# Patient Record
Sex: Female | Born: 1985 | Race: Black or African American | Hispanic: No | Marital: Married | Smoking: Never smoker
Health system: Southern US, Community
[De-identification: ages and names within clinical notes are randomized; demographics above are authoritative.]

## PROBLEM LIST (undated history)

## (undated) ENCOUNTER — Inpatient Hospital Stay (HOSPITAL_COMMUNITY): Payer: Self-pay

## (undated) DIAGNOSIS — O139 Gestational [pregnancy-induced] hypertension without significant proteinuria, unspecified trimester: Secondary | ICD-10-CM

## (undated) DIAGNOSIS — O24419 Gestational diabetes mellitus in pregnancy, unspecified control: Secondary | ICD-10-CM

## (undated) DIAGNOSIS — A6000 Herpesviral infection of urogenital system, unspecified: Secondary | ICD-10-CM

## (undated) HISTORY — DX: Gestational (pregnancy-induced) hypertension without significant proteinuria, unspecified trimester: O13.9

## (undated) HISTORY — DX: Gestational diabetes mellitus in pregnancy, unspecified control: O24.419

## (undated) HISTORY — PX: WISDOM TOOTH EXTRACTION: SHX21

## (undated) HISTORY — DX: Herpesviral infection of urogenital system, unspecified: A60.00

## (undated) HISTORY — PX: CHOLECYSTECTOMY: SHX55

## (undated) HISTORY — PX: INDUCED ABORTION: SHX677

---

## 2006-11-03 DIAGNOSIS — A6000 Herpesviral infection of urogenital system, unspecified: Secondary | ICD-10-CM

## 2006-11-03 HISTORY — DX: Herpesviral infection of urogenital system, unspecified: A60.00

## 2016-11-03 NOTE — L&D Delivery Note (Signed)
Delivery Note  This is a 31 year old G 2 P0 who was admitted for GDM: A2 Metformin. She progressed with Pitocin, amnioinfusion, and epidural  to the second stage of labor.  SROM today at noon.  Antibiotics started atShe pushed for 45 min.  She delivered a viable infant female, cephalic, LOA. She had a second degree tear.  NICU present for delivery due to mild meconium, amnioinfusion, fetal tachycardia and light meconium.   A nuchal cord   was not identified. Infant placed on maternal abdomen.  Delayed cord clamping was performed for 2 minutes.  Cord double clamped and cut. Infant transferred to warmer for NICU evaluation. Apgar scores were 8 and 9. The placenta delivered spontaneously, shultz, with a 3 vessel cord.  Inspection revealed 2nd degree. The uterus was firm bleeding stable.  The repair was done under epidural.   EBL was 150.    Placenta and umbilical artery blood gas were not sent.  There were no complications during the procedure.  Mom and baby skin to skin following delivery. Left in stable condition.  Orvilla Cornwallachelle Denney CNM  Please schedule this patient for Postpartum visit in: 6 weeks with the following provider: Any provider For C/S patients schedule nurse incision check in weeks 2 weeks: no High risk pregnancy complicated by: GDM Delivery mode:  SVD Anticipated Birth Control:  other/unsure PP Procedures needed: 2 hour GTT  Schedule Integrated BH visit: no

## 2017-02-23 ENCOUNTER — Ambulatory Visit (INDEPENDENT_AMBULATORY_CARE_PROVIDER_SITE_OTHER): Payer: Managed Care, Other (non HMO) | Admitting: Adult Health

## 2017-02-23 ENCOUNTER — Encounter: Payer: Self-pay | Admitting: Adult Health

## 2017-02-23 VITALS — BP 130/80 | HR 84 | Ht 62.0 in | Wt 255.0 lb

## 2017-02-23 DIAGNOSIS — O3680X0 Pregnancy with inconclusive fetal viability, not applicable or unspecified: Secondary | ICD-10-CM

## 2017-02-23 DIAGNOSIS — Z3490 Encounter for supervision of normal pregnancy, unspecified, unspecified trimester: Secondary | ICD-10-CM

## 2017-02-23 DIAGNOSIS — Z3201 Encounter for pregnancy test, result positive: Secondary | ICD-10-CM | POA: Diagnosis not present

## 2017-02-23 DIAGNOSIS — N912 Amenorrhea, unspecified: Secondary | ICD-10-CM | POA: Diagnosis not present

## 2017-02-23 LAB — POCT URINE PREGNANCY: PREG TEST UR: POSITIVE — AB

## 2017-02-23 NOTE — Patient Instructions (Signed)
First Trimester of Pregnancy The first trimester of pregnancy is from week 1 until the end of week 13 (months 1 through 3). A week after a sperm fertilizes an egg, the egg will implant on the wall of the uterus. This embryo will begin to develop into a baby. Genes from you and your partner will form the baby. The female genes will determine whether the baby will be a boy or a girl. At 6-8 weeks, the eyes and face will be formed, and the heartbeat can be seen on ultrasound. At the end of 12 weeks, all the baby's organs will be formed. Now that you are pregnant, you will want to do everything you can to have a healthy baby. Two of the most important things are to get good prenatal care and to follow your health care provider's instructions. Prenatal care is all the medical care you receive before the baby's birth. This care will help prevent, find, and treat any problems during the pregnancy and childbirth. Body changes during your first trimester Your body goes through many changes during pregnancy. The changes vary from woman to woman.  You may gain or lose a couple of pounds at first.  You may feel sick to your stomach (nauseous) and you may throw up (vomit). If the vomiting is uncontrollable, call your health care provider.  You may tire easily.  You may develop headaches that can be relieved by medicines. All medicines should be approved by your health care provider.  You may urinate more often. Painful urination may mean you have a bladder infection.  You may develop heartburn as a result of your pregnancy.  You may develop constipation because certain hormones are causing the muscles that push stool through your intestines to slow down.  You may develop hemorrhoids or swollen veins (varicose veins).  Your breasts may begin to grow larger and become tender. Your nipples may stick out more, and the tissue that surrounds them (areola) may become darker.  Your gums may bleed and may be  sensitive to brushing and flossing.  Dark spots or blotches (chloasma, mask of pregnancy) may develop on your face. This will likely fade after the baby is born.  Your menstrual periods will stop.  You may have a loss of appetite.  You may develop cravings for certain kinds of food.  You may have changes in your emotions from day to day, such as being excited to be pregnant or being concerned that something may go wrong with the pregnancy and baby.  You may have more vivid and strange dreams.  You may have changes in your hair. These can include thickening of your hair, rapid growth, and changes in texture. Some women also have hair loss during or after pregnancy, or hair that feels dry or thin. Your hair will most likely return to normal after your baby is born.  What to expect at prenatal visits During a routine prenatal visit:  You will be weighed to make sure you and the baby are growing normally.  Your blood pressure will be taken.  Your abdomen will be measured to track your baby's growth.  The fetal heartbeat will be listened to between weeks 10 and 14 of your pregnancy.  Test results from any previous visits will be discussed.  Your health care provider may ask you:  How you are feeling.  If you are feeling the baby move.  If you have had any abnormal symptoms, such as leaking fluid, bleeding, severe headaches,   or abdominal cramping.  If you are using any tobacco products, including cigarettes, chewing tobacco, and electronic cigarettes.  If you have any questions.  Other tests that may be performed during your first trimester include:  Blood tests to find your blood type and to check for the presence of any previous infections. The tests will also be used to check for low iron levels (anemia) and protein on red blood cells (Rh antibodies). Depending on your risk factors, or if you previously had diabetes during pregnancy, you may have tests to check for high blood  sugar that affects pregnant women (gestational diabetes).  Urine tests to check for infections, diabetes, or protein in the urine.  An ultrasound to confirm the proper growth and development of the baby.  Fetal screens for spinal cord problems (spina bifida) and Down syndrome.  HIV (human immunodeficiency virus) testing. Routine prenatal testing includes screening for HIV, unless you choose not to have this test.  You may need other tests to make sure you and the baby are doing well.  Follow these instructions at home: Medicines  Follow your health care provider's instructions regarding medicine use. Specific medicines may be either safe or unsafe to take during pregnancy.  Take a prenatal vitamin that contains at least 600 micrograms (mcg) of folic acid.  If you develop constipation, try taking a stool softener if your health care provider approves. Eating and drinking  Eat a balanced diet that includes fresh fruits and vegetables, whole grains, good sources of protein such as meat, eggs, or tofu, and low-fat dairy. Your health care provider will help you determine the amount of weight gain that is right for you.  Avoid raw meat and uncooked cheese. These carry germs that can cause birth defects in the baby.  Eating four or five small meals rather than three large meals a day may help relieve nausea and vomiting. If you start to feel nauseous, eating a few soda crackers can be helpful. Drinking liquids between meals, instead of during meals, also seems to help ease nausea and vomiting.  Limit foods that are high in fat and processed sugars, such as fried and sweet foods.  To prevent constipation: ? Eat foods that are high in fiber, such as fresh fruits and vegetables, whole grains, and beans. ? Drink enough fluid to keep your urine clear or pale yellow. Activity  Exercise only as directed by your health care provider. Most women can continue their usual exercise routine during  pregnancy. Try to exercise for 30 minutes at least 5 days a week. Exercising will help you: ? Control your weight. ? Stay in shape. ? Be prepared for labor and delivery.  Experiencing pain or cramping in the lower abdomen or lower back is a good sign that you should stop exercising. Check with your health care provider before continuing with normal exercises.  Try to avoid standing for long periods of time. Move your legs often if you must stand in one place for a long time.  Avoid heavy lifting.  Wear low-heeled shoes and practice good posture.  You may continue to have sex unless your health care provider tells you not to. Relieving pain and discomfort  Wear a good support bra to relieve breast tenderness.  Take warm sitz baths to soothe any pain or discomfort caused by hemorrhoids. Use hemorrhoid cream if your health care provider approves.  Rest with your legs elevated if you have leg cramps or low back pain.  If you develop   varicose veins in your legs, wear support hose. Elevate your feet for 15 minutes, 3-4 times a day. Limit salt in your diet. Prenatal care  Schedule your prenatal visits by the twelfth week of pregnancy. They are usually scheduled monthly at first, then more often in the last 2 months before delivery.  Write down your questions. Take them to your prenatal visits.  Keep all your prenatal visits as told by your health care provider. This is important. Safety  Wear your seat belt at all times when driving.  Make a list of emergency phone numbers, including numbers for family, friends, the hospital, and police and fire departments. General instructions  Ask your health care provider for a referral to a local prenatal education class. Begin classes no later than the beginning of month 6 of your pregnancy.  Ask for help if you have counseling or nutritional needs during pregnancy. Your health care provider can offer advice or refer you to specialists for help  with various needs.  Do not use hot tubs, steam rooms, or saunas.  Do not douche or use tampons or scented sanitary pads.  Do not cross your legs for long periods of time.  Avoid cat litter boxes and soil used by cats. These carry germs that can cause birth defects in the baby and possibly loss of the fetus by miscarriage or stillbirth.  Avoid all smoking, herbs, alcohol, and medicines not prescribed by your health care provider. Chemicals in these products affect the formation and growth of the baby.  Do not use any products that contain nicotine or tobacco, such as cigarettes and e-cigarettes. If you need help quitting, ask your health care provider. You may receive counseling support and other resources to help you quit.  Schedule a dentist appointment. At home, brush your teeth with a soft toothbrush and be gentle when you floss. Contact a health care provider if:  You have dizziness.  You have mild pelvic cramps, pelvic pressure, or nagging pain in the abdominal area.  You have persistent nausea, vomiting, or diarrhea.  You have a bad smelling vaginal discharge.  You have pain when you urinate.  You notice increased swelling in your face, hands, legs, or ankles.  You are exposed to fifth disease or chickenpox.  You are exposed to German measles (rubella) and have never had it. Get help right away if:  You have a fever.  You are leaking fluid from your vagina.  You have spotting or bleeding from your vagina.  You have severe abdominal cramping or pain.  You have rapid weight gain or loss.  You vomit blood or material that looks like coffee grounds.  You develop a severe headache.  You have shortness of breath.  You have any kind of trauma, such as from a fall or a car accident. Summary  The first trimester of pregnancy is from week 1 until the end of week 13 (months 1 through 3).  Your body goes through many changes during pregnancy. The changes vary from  woman to woman.  You will have routine prenatal visits. During those visits, your health care provider will examine you, discuss any test results you may have, and talk with you about how you are feeling. This information is not intended to replace advice given to you by your health care provider. Make sure you discuss any questions you have with your health care provider. Document Released: 10/14/2001 Document Revised: 10/01/2016 Document Reviewed: 10/01/2016 Elsevier Interactive Patient Education  2017 Elsevier   Inc.  

## 2017-02-23 NOTE — Progress Notes (Signed)
Subjective:     Patient ID: Tiffany Dyer, female   DOB: 09/15/1986, 31 y.o.   MRN: [id number]  HPI Tiffany Dyer is a 31 year old black female in for UPT, has missed a period and had +HPT.  Review of Systems +missed period,+HPT +heartburn and reflux +nausea,better when eats +cramps +shortness of breath in shower Reviewed past medical,surgical, social and family history. Reviewed medications and allergies.     Objective:   Physical Exam BP 130/80 (BP Location: Left Arm, Patient Position: Sitting, Cuff Size: Large)   Pulse 84   Ht  (1.575 m)   Wt 255 lb (115.7 kg)   LMP 01/08/2017 (Exact Date)   BMI 46.64 kg/m UPT +, about 6+4 weeks by LMP with EDD 10/15/17,Skin warm and dry. Neck: mid line trachea, normal thyroid, good ROM, no lymphadenopathy noted. Lungs: clear to ausculation bilaterally. Cardiovascular: regular rate and rhythm. Abdomen is soft and non tender. PHQ 2 score 0.    Assessment:     1. Pregnancy examination or test, positive result   2. Pregnancy, unspecified gestational age   55. Encounter to determine fetal viability of pregnancy, single or unspecified fetus       Plan:    OK to claritin or zyrtec  Ok to take [employer] often,do not eat and drink at same time Return in 1 week for dating [place] Review handout on first trimester

## 2017-03-02 ENCOUNTER — Ambulatory Visit (INDEPENDENT_AMBULATORY_CARE_PROVIDER_SITE_OTHER): Payer: Managed Care, Other (non HMO)

## 2017-03-02 DIAGNOSIS — O3680X0 Pregnancy with inconclusive fetal viability, not applicable or unspecified: Secondary | ICD-10-CM | POA: Diagnosis not present

## 2017-03-02 NOTE — Progress Notes (Signed)
[place] 7+4 wks,single IUP w/ys,positive fht 135 bpm,subchorionic hemorrhage 1.8 x 1.7 x .9 cm,normal ovaries bilat,crl 12.1 mm,EDD 10/15/2017 by LMP

## 2017-03-06 ENCOUNTER — Telehealth: Payer: Self-pay | Admitting: *Deleted

## 2017-03-06 NOTE — Telephone Encounter (Signed)
Patient called with complaints of nausea and wanted OTC recommendations. Advised she could try Bonine, Vitamin B6 50mg  BID or Unisom, ginger extract or even peppermint candy, small frequent meals and small quantities of fluid at a time.  Advised to call back if symptoms are not better. Pt verbalized understanding.

## 2017-03-09 ENCOUNTER — Telehealth: Payer: Self-pay | Admitting: Obstetrics & Gynecology

## 2017-03-09 ENCOUNTER — Telehealth: Payer: Self-pay | Admitting: *Deleted

## 2017-03-09 MED ORDER — DOXYLAMINE-PYRIDOXINE ER 20-20 MG PO TBCR: 1.0000 | EXTENDED_RELEASE_TABLET | Freq: Every day | ORAL | 6 refills | Status: DC

## 2017-03-09 NOTE — Telephone Encounter (Signed)
Spoke to patient who states she has had nausea and vomiting all weekend. She has tried Zofran with some relief but is still having vomiting episodes. She has also tried B6. Can she try something else? Bonajesta? Please advise.

## 2017-03-09 NOTE — Telephone Encounter (Signed)
Pt aware that bonjesta Rx [facility] to [facility] [place]

## 2017-03-10 ENCOUNTER — Telehealth: Payer: Self-pay | Admitting: Adult Health

## 2017-03-10 MED ORDER — ONDANSETRON HCL 4 MG PO TABS: 4.0000 mg | ORAL_TABLET | Freq: Three times a day (TID) | ORAL | 1 refills | Status: DC | PRN

## 2017-03-10 NOTE — Telephone Encounter (Signed)
Pt wants refill on zofran, will rx zofran.

## 2017-03-10 NOTE — Telephone Encounter (Signed)
PA done but not approved yet, wants refill on Zofran until we get approval. Please advise.

## 2017-03-11 ENCOUNTER — Telehealth: Payer: Self-pay | Admitting: *Deleted

## 2017-03-11 NOTE — Telephone Encounter (Signed)
Informed patient that Tiffany DeistBonjesta was denied but we have rep coming on May 24 and will have samples. Patient stated she was feeling better and didn't think she needed it but would let us know if she did.

## 2017-03-13 ENCOUNTER — Telehealth: Payer: Self-pay | Admitting: *Deleted

## 2017-03-13 NOTE — Telephone Encounter (Signed)
Patient called stating she woke up and had 1 episode of spotting after using the bathroom. No recent intercourse or cramping. Advised that spotting is very common especially in the first trimester. Encouraged pelvic rest for 7 days, to push fluids and empty bladder frequently. Advised to call is bleeding becomes heavier or severe cramping. Verbalized understanding.

## 2017-03-17 ENCOUNTER — Ambulatory Visit: Payer: Managed Care, Other (non HMO) | Admitting: *Deleted

## 2017-03-17 ENCOUNTER — Encounter: Payer: Self-pay | Admitting: Women's Health

## 2017-03-17 ENCOUNTER — Ambulatory Visit (INDEPENDENT_AMBULATORY_CARE_PROVIDER_SITE_OTHER): Payer: Managed Care, Other (non HMO) | Admitting: Women's Health

## 2017-03-17 ENCOUNTER — Other Ambulatory Visit (HOSPITAL_COMMUNITY)
Admission: RE | Admit: 2017-03-17 | Discharge: 2017-03-17 | Disposition: A | Discharge status: Home / Self Care | Payer: Managed Care, Other (non HMO) | Source: Ambulatory Visit | Attending: Obstetrics & Gynecology | Admitting: Obstetrics & Gynecology

## 2017-03-17 VITALS — BP 122/78 | HR 80 | Wt 253.4 lb

## 2017-03-17 DIAGNOSIS — Z3481 Encounter for supervision of other normal pregnancy, first trimester: Secondary | ICD-10-CM | POA: Diagnosis present

## 2017-03-17 DIAGNOSIS — O0990 Supervision of high risk pregnancy, unspecified, unspecified trimester: Secondary | ICD-10-CM | POA: Insufficient documentation

## 2017-03-17 DIAGNOSIS — Z1389 Encounter for screening for other disorder: Secondary | ICD-10-CM

## 2017-03-17 DIAGNOSIS — Z3A09 9 weeks gestation of pregnancy: Secondary | ICD-10-CM | POA: Diagnosis not present

## 2017-03-17 DIAGNOSIS — Z131 Encounter for screening for diabetes mellitus: Secondary | ICD-10-CM

## 2017-03-17 DIAGNOSIS — Z331 Pregnant state, incidental: Secondary | ICD-10-CM

## 2017-03-17 DIAGNOSIS — Z3682 Encounter for antenatal screening for nuchal translucency: Secondary | ICD-10-CM

## 2017-03-17 DIAGNOSIS — Z124 Encounter for screening for malignant neoplasm of cervix: Secondary | ICD-10-CM

## 2017-03-17 LAB — POCT URINALYSIS DIPSTICK
Glucose, UA: NEGATIVE
Ketones, UA: NEGATIVE
Nitrite, UA: NEGATIVE
Protein, UA: NEGATIVE

## 2017-03-17 MED ORDER — PROMETHAZINE HCL 25 MG PO TABS: 12.5000 mg | ORAL_TABLET | Freq: Four times a day (QID) | ORAL | 1 refills | Status: DC | PRN

## 2017-03-17 NOTE — Patient Instructions (Signed)

## 2017-03-17 NOTE — Progress Notes (Signed)
  Subjective:  Tiffany Dyer is a 31 y.o. 762P0010 African American female at 7412w5d by LMP c/w 7wk u/s, being seen today for her first obstetrical visit.  Her obstetrical history is significant for EAB x 1.  Pregnancy history fully reviewed.  Patient reports nausea, was better, now returning- has rx for zofran at home was taking q 8hrs around clock, no vomiting in awhile. Had been rx'd [place] but insurance didn't cover it. Requests a different medicine for n/v. Denies vb, cramping, uti s/s, abnormal/malodorous vag d/c, or vulvovaginal itching/irritation.  BP 122/78   Pulse 80   Wt 253 lb 6.4 oz (114.9 kg)   LMP 01/08/2017 (Exact Date)   BMI 46.35 kg/m   HISTORY: OB History  Gravida Para Term Preterm AB Living  2       1    SAB TAB Ectopic Multiple Live Births               # Outcome Date GA Lbr Len/2nd Weight Sex Delivery Anes PTL Lv  2 Current           1 AB 2008             Past Medical History:  Diagnosis Date  . Genital herpes 2008   Past Surgical History:  Procedure Laterality Date  . WISDOM TOOTH EXTRACTION     Family History  Problem Relation Age of Onset  . Other Paternal Grandfather        poor circulation  . Diabetes Paternal Grandmother   . Hypertension Maternal Grandmother   . Cancer Maternal Grandfather        kidney  . Diabetes Father     Exam   System:     General: Well developed & nourished, no acute distress   Skin: Warm & dry, normal coloration and turgor, no rashes   Neurologic: Alert & oriented, normal mood   Cardiovascular: Regular rate & rhythm   Respiratory: Effort & rate normal, LCTAB, acyanotic   Abdomen: Soft, non tender   Extremities: normal strength, tone   Pelvic Exam:    Perineum: Normal perineum   Vulva: Normal, no lesions   Vagina:  Normal mucosa, normal discharge, no bleeding noted, small amt spotting after pap   Cervix: Normal, bulbous, appears closed   Uterus: Normal size/shape/contour for GA   Thin prep pap smear  obtained w/ high risk HPV cotesting FHR: + via informal u/s   Assessment:   Pregnancy: G2P0010 Patient Active Problem List   Diagnosis Date Noted  . Supervision of normal pregnancy 03/17/2017    7712w5d G2P0010 New OB visit Nausea Spotting  Plan:  Initial labs obtained Continue prenatal vitamins Problem list reviewed and updated Reviewed n/v relief measures and warning s/s to report Rx phenergan for n/v of pregnancy Reviewed recommended weight gain based on pre-gravid BMI Encouraged well-balanced diet Genetic Screening discussed Integrated Screen: requested Cystic fibrosis screening discussed declined Ultrasound discussed; fetal survey: requested Follow up in 3 weeks for 1st IT/NT and visit CCNC completed, not sent, not applying for preg mcaid   Marge DuncansBooker, Nelma Phagan Randall CNM, WHNP-BC 03/17/2017 4:00 PM

## 2017-03-18 LAB — PMP SCREEN PROFILE (10S), URINE
AMPHETAMINE SCREEN URINE: NEGATIVE ng/mL
BARBITURATE SCREEN URINE: NEGATIVE ng/mL
BENZODIAZEPINE SCREEN, URINE: NEGATIVE ng/mL
CANNABINOIDS UR QL SCN: NEGATIVE ng/mL
Cocaine (Metab) Scrn, Ur: NEGATIVE ng/mL
Creatinine(Crt), U: 182.4 mg/dL (ref 20.0–300.0)
Methadone Screen, Urine: NEGATIVE ng/mL
OXYCODONE+OXYMORPHONE UR QL SCN: NEGATIVE ng/mL
Opiate Scrn, Ur: NEGATIVE ng/mL
PH UR, DRUG SCRN: 6.2 (ref 4.5–8.9)
PHENCYCLIDINE QUANTITATIVE URINE: NEGATIVE ng/mL
PROPOXYPHENE SCREEN URINE: NEGATIVE ng/mL

## 2017-03-18 LAB — CBC
HEMOGLOBIN: 12.2 g/dL (ref 11.1–15.9)
Hematocrit: 37 % (ref 34.0–46.6)
MCH: 31.8 pg (ref 26.6–33.0)
MCHC: 33 g/dL (ref 31.5–35.7)
MCV: 96 fL (ref 79–97)
PLATELETS: 370 10*3/uL (ref 150–379)
RBC: 3.84 x10E6/uL (ref 3.77–5.28)
RDW: 14.8 % (ref 12.3–15.4)
WBC: 12 10*3/uL — AB (ref 3.4–10.8)

## 2017-03-18 LAB — URINALYSIS, ROUTINE W REFLEX MICROSCOPIC
BILIRUBIN UA: NEGATIVE
Glucose, UA: NEGATIVE
Nitrite, UA: NEGATIVE
PH UA: 5.5 (ref 5.0–7.5)
PROTEIN UA: NEGATIVE
RBC UA: NEGATIVE
Specific Gravity, UA: 1.024 (ref 1.005–1.030)
UUROB: 0.2 mg/dL (ref 0.2–1.0)

## 2017-03-18 LAB — MICROSCOPIC EXAMINATION: CASTS: NONE SEEN /LPF

## 2017-03-18 LAB — RPR: RPR Ser Ql: NONREACTIVE

## 2017-03-18 LAB — ANTIBODY SCREEN: Antibody Screen: NEGATIVE

## 2017-03-18 LAB — SICKLE CELL SCREEN: Sickle Cell Screen: NEGATIVE

## 2017-03-18 LAB — RUBELLA SCREEN: Rubella Antibodies, IGG: 1.83 index (ref >0.99)

## 2017-03-18 LAB — VARICELLA ZOSTER ANTIBODY, IGG: VARICELLA: 1122 {index} (ref >165)

## 2017-03-18 LAB — ABO/RH: Rh Factor: POSITIVE

## 2017-03-18 LAB — HEPATITIS B SURFACE ANTIGEN: Hepatitis B Surface Ag: NEGATIVE

## 2017-03-18 LAB — HIV ANTIBODY (ROUTINE TESTING W REFLEX): HIV SCREEN 4TH GENERATION: NONREACTIVE

## 2017-03-19 LAB — CYTOLOGY - PAP
Chlamydia: NEGATIVE
DIAGNOSIS: NEGATIVE
HPV (WINDOPATH): NOT DETECTED
Neisseria Gonorrhea: NEGATIVE

## 2017-03-19 LAB — URINE CULTURE

## 2017-03-23 ENCOUNTER — Telehealth: Payer: Self-pay | Admitting: Women's Health

## 2017-03-23 NOTE — Telephone Encounter (Signed)
Pt states she has had some spotting all last week, she had a PAP done Wednesday and spotting was worse on Friday.  Pt denies any cramping, flow of blood or passing any clots, no bleeding since friday.  Advised pt can be normal to have spotting in early pregnancy, especially after sex, straining with BM or after having a PAP done.  Advised pt pelvic rest until at least a week after any bleeding and let us know if develops any new symptoms or gets worse and to push H2O.  Pt verbalized understanding.

## 2017-03-31 ENCOUNTER — Encounter: Payer: Self-pay | Admitting: Women's Health

## 2017-04-08 ENCOUNTER — Ambulatory Visit (INDEPENDENT_AMBULATORY_CARE_PROVIDER_SITE_OTHER): Payer: Managed Care, Other (non HMO) | Admitting: Advanced Practice Midwife

## 2017-04-08 ENCOUNTER — Ambulatory Visit (INDEPENDENT_AMBULATORY_CARE_PROVIDER_SITE_OTHER): Payer: Managed Care, Other (non HMO)

## 2017-04-08 ENCOUNTER — Encounter: Payer: Self-pay | Admitting: Advanced Practice Midwife

## 2017-04-08 VITALS — BP 124/84 | HR 89 | Wt 249.0 lb

## 2017-04-08 DIAGNOSIS — Z1389 Encounter for screening for other disorder: Secondary | ICD-10-CM

## 2017-04-08 DIAGNOSIS — Z3682 Encounter for antenatal screening for nuchal translucency: Secondary | ICD-10-CM | POA: Diagnosis not present

## 2017-04-08 DIAGNOSIS — Z3481 Encounter for supervision of other normal pregnancy, first trimester: Secondary | ICD-10-CM

## 2017-04-08 DIAGNOSIS — Z331 Pregnant state, incidental: Secondary | ICD-10-CM

## 2017-04-08 LAB — POCT URINALYSIS DIPSTICK
Glucose, UA: NEGATIVE
KETONES UA: NEGATIVE
Nitrite, UA: NEGATIVE
Protein, UA: NEGATIVE

## 2017-04-08 MED ORDER — ONDANSETRON HCL 4 MG PO TABS: 4.0000 mg | ORAL_TABLET | Freq: Three times a day (TID) | ORAL | 2 refills | Status: DC | PRN

## 2017-04-08 NOTE — Patient Instructions (Signed)

## 2017-04-08 NOTE — Progress Notes (Signed)
G2P0010 1287w6d Estimated Date of Delivery: 10/15/17  Blood pressure 124/84, pulse 89, weight 249 lb (112.9 kg), last menstrual period 01/08/2017.   BP weight and urine results all reviewed and noted.  Please refer to the obstetrical flow sheet for the fundal height and fetal heart rate documentation:  Patient denies any bleeding and no rupture of membranes symptoms or regular contractions. Patient c/o always having a bad (metallic) taste.  Try prilosce.   All questions were answered.  Orders Placed This Encounter  Procedures  . Maternal Screen, Integrated #1  . POCT urinalysis dipstick    Plan:  Continued routine obstetrical care,    rx zofran  Return in about 4 weeks (around 05/06/2017) for LROB, 2nd IT.

## 2017-04-08 NOTE — Progress Notes (Signed)
NT/First Trimester screening US today @[redacted]w[redacted]d  GA. Single active fetus with FHR 152bpm. CRL 67.601mm which is consistent with LMP dating. EDD 10/15/17. Cervix closed. Bilateral ovaries WNL. No free fluid. Fluid is subjectively WNL. NT measurement 1.697mm.

## 2017-04-09 ENCOUNTER — Encounter: Payer: Self-pay | Admitting: Women's Health

## 2017-04-11 LAB — MATERNAL SCREEN, INTEGRATED #1
CROWN RUMP LENGTH MAT SCREEN: 67.1 mm
Gest. Age on Collection Date: 12.9 weeks
Maternal Age at EDD: 32.6 yr
Nuchal Translucency (NT): 1.7 mm
Number of Fetuses: 1
PAPP-A VALUE: 1039 ng/mL
WEIGHT: 249 [lb_av]

## 2017-04-13 ENCOUNTER — Telehealth: Payer: Self-pay | Admitting: Obstetrics & Gynecology

## 2017-04-13 NOTE — Telephone Encounter (Signed)
Patient called stating she noticed light spotting after intercourse last night with minimal cramping. Informed light spotting can be normal. No sex for 5-7 days until bleeding stops. Encouraged to push fluids and empty bladder frequently and to call if bleeding or cramping intensifies. Verbalized understanding.

## 2017-04-20 ENCOUNTER — Telehealth: Payer: Self-pay | Admitting: *Deleted

## 2017-04-20 NOTE — Telephone Encounter (Signed)
Spoke with pt. Pt is having urinary urgency x 1 week. I advised she needs to be seen. Pt was advised to push fluids.  Pt voiced understanding. Call was transferred to front desk for appt. JSY

## 2017-04-24 ENCOUNTER — Ambulatory Visit (INDEPENDENT_AMBULATORY_CARE_PROVIDER_SITE_OTHER): Payer: Managed Care, Other (non HMO) | Admitting: Women's Health

## 2017-04-24 VITALS — BP 128/60 | HR 78 | Wt 257.6 lb

## 2017-04-24 DIAGNOSIS — Z363 Encounter for antenatal screening for malformations: Secondary | ICD-10-CM

## 2017-04-24 DIAGNOSIS — R3915 Urgency of urination: Secondary | ICD-10-CM

## 2017-04-24 DIAGNOSIS — Z3682 Encounter for antenatal screening for nuchal translucency: Secondary | ICD-10-CM

## 2017-04-24 DIAGNOSIS — Z331 Pregnant state, incidental: Secondary | ICD-10-CM

## 2017-04-24 DIAGNOSIS — Z1389 Encounter for screening for other disorder: Secondary | ICD-10-CM

## 2017-04-24 DIAGNOSIS — Z3402 Encounter for supervision of normal first pregnancy, second trimester: Secondary | ICD-10-CM

## 2017-04-24 LAB — POCT URINALYSIS DIPSTICK
Glucose, UA: NEGATIVE
Leukocytes, UA: NEGATIVE
NITRITE UA: NEGATIVE
RBC UA: NEGATIVE

## 2017-04-24 NOTE — Progress Notes (Signed)
Work-in Low-risk OB appointment G2P0010 5885w1d Estimated Date of Delivery: 10/15/17 BP 128/60   Pulse 78   Wt 257 lb 9.6 oz (116.8 kg)   LMP 01/08/2017 (Exact Date)   BMI 47.12 kg/m   BP, weight, and urine reviewed.  Refer to obstetrical flow sheet for FH & FHR.  No fm yet. Denies cramping, lof, vb. Reports intermittent urinary urgency, is not even on a daily basis. No increased frequency/dysuria or other sx. Will send urine cx. Discussed emperical tx d/t r/f pyelo w/ pregnancy- pt prefers to wait until urine cx results. Discussed s/s pyelo/reasons to seek care. To increase po water intake, enough so urine light yellow/clear.  Reviewed warning s/s to report. Plan:  Continue routine obstetrical care  F/U in 3wks for OB appointment and anatomy u/s (cancel 7/9 visit) 2nd IT today

## 2017-04-24 NOTE — Patient Instructions (Signed)

## 2017-04-26 LAB — URINE CULTURE: Organism ID, Bacteria: NO GROWTH

## 2017-04-30 LAB — MATERNAL SCREEN, INTEGRATED #2
AFP MARKER: 19 ng/mL
AFP MoM: 0.96
CROWN RUMP LENGTH: 67.1 mm
DIA MOM: 0.49
DIA VALUE: 66.6 pg/mL
Estriol, Unconjugated: 0.29 ng/mL
GESTATIONAL AGE: 15.1 wk
Gest. Age on Collection Date: 12.9 weeks
Maternal Age at EDD: 32.6 yr
NUCHAL TRANSLUCENCY MOM: 1.02
Nuchal Translucency (NT): 1.7 mm
Number of Fetuses: 1
PAPP-A MOM: 1.84
PAPP-A VALUE: 1039 ng/mL
TEST RESULTS: NEGATIVE
Weight: 249 [lb_av]
Weight: 249 [lb_av]
hCG MoM: 0.91
hCG Value: 26.7 IU/mL
uE3 MoM: 0.54

## 2017-05-07 ENCOUNTER — Encounter: Payer: Managed Care, Other (non HMO) | Admitting: Women's Health

## 2017-05-11 ENCOUNTER — Encounter: Payer: Managed Care, Other (non HMO) | Admitting: Women's Health

## 2017-05-15 ENCOUNTER — Encounter: Payer: Self-pay | Admitting: Women's Health

## 2017-05-15 ENCOUNTER — Ambulatory Visit (INDEPENDENT_AMBULATORY_CARE_PROVIDER_SITE_OTHER): Payer: Managed Care, Other (non HMO)

## 2017-05-15 ENCOUNTER — Encounter: Payer: Self-pay | Admitting: Obstetrics & Gynecology

## 2017-05-15 ENCOUNTER — Encounter: Payer: Managed Care, Other (non HMO) | Admitting: Obstetrics & Gynecology

## 2017-05-15 DIAGNOSIS — Z3402 Encounter for supervision of normal first pregnancy, second trimester: Secondary | ICD-10-CM

## 2017-05-15 DIAGNOSIS — Z363 Encounter for antenatal screening for malformations: Secondary | ICD-10-CM

## 2017-05-15 DIAGNOSIS — O283 Abnormal ultrasonic finding on antenatal screening of mother: Secondary | ICD-10-CM | POA: Insufficient documentation

## 2017-05-15 LAB — POCT URINALYSIS DIPSTICK
GLUCOSE UA: NEGATIVE
KETONES UA: NEGATIVE
Nitrite, UA: NEGATIVE
Protein, UA: NEGATIVE

## 2017-05-15 NOTE — Progress Notes (Signed)
US 18+1 wks,cephalic,LVEICF 2 mm,post pl gr 0,svp of fluid 5.3 cm,cx 3.8 cm,fhr 157 bpm,efw 234 g,anatomy complete,no obvious abnormalities seen

## 2017-05-16 NOTE — Progress Notes (Signed)
   Patient ID: Tiffany Dyer, female   DOB: 08-15-1986, 31 y.o.   MRN: [id number]   This encounter was created in error - please disregard.

## 2017-05-18 ENCOUNTER — Encounter: Payer: Self-pay | Admitting: Women's Health

## 2017-05-21 ENCOUNTER — Telehealth: Payer: Self-pay | Admitting: *Deleted

## 2017-05-21 NOTE — Telephone Encounter (Signed)
Patient called with concerns of LV EICF on U/S. Informed patient that since she has nt/it and it was negative, there was not a need to repeat u/s for this. Verbalized understanding.

## 2017-05-22 ENCOUNTER — Ambulatory Visit (INDEPENDENT_AMBULATORY_CARE_PROVIDER_SITE_OTHER): Payer: Managed Care, Other (non HMO) | Admitting: Obstetrics & Gynecology

## 2017-05-22 VITALS — BP 138/82 | HR 107 | Wt 259.0 lb

## 2017-05-22 DIAGNOSIS — Z3402 Encounter for supervision of normal first pregnancy, second trimester: Secondary | ICD-10-CM

## 2017-05-22 DIAGNOSIS — Z3A19 19 weeks gestation of pregnancy: Secondary | ICD-10-CM

## 2017-05-22 DIAGNOSIS — Z331 Pregnant state, incidental: Secondary | ICD-10-CM

## 2017-05-22 DIAGNOSIS — Z1389 Encounter for screening for other disorder: Secondary | ICD-10-CM

## 2017-05-22 LAB — POCT URINALYSIS DIPSTICK
KETONES UA: NEGATIVE
Nitrite, UA: NEGATIVE
RBC UA: NEGATIVE

## 2017-05-22 NOTE — Progress Notes (Signed)
G2P0010 113w1d Estimated Date of Delivery: 10/15/17  Blood pressure 138/82, pulse (!) 107, weight 259 lb (117.5 kg), last menstrual period 01/08/2017.   BP weight and urine results all reviewed and noted.  Please refer to the obstetrical flow sheet for the fundal height and fetal heart rate documentation:  Patient reports good fetal movement, denies any bleeding and no rupture of membranes symptoms or regular contractions. Patient is without complaints. All questions were answered.  Orders Placed This Encounter  Procedures  . POCT urinalysis dipstick    Plan:  Continued routine obstetrical care, sonogram is reviewed today reveals an isolated left ventricular echogenic intracardiac focus no other abnormalities are noted and no follow-up is needed Spent considerable time explaining this to the patient her husband and she understands is a little upset but knows that this is relatively normal finding and no follow-up is needed with an isolated finding  Return in about 4 weeks (around 06/19/2017) for LROB.

## 2017-05-27 ENCOUNTER — Telehealth: Payer: Self-pay | Admitting: *Deleted

## 2017-05-27 NOTE — Telephone Encounter (Signed)
Patient called with complaints of right sided "groin pain" that comes and goes. She just took a walk and noticed it. Informed patient that it sounded like round ligament pain which is very common during pregnancy. Informed patient she could try using a pregnancy belly band to help support her uterus as it grows. Verbalized understanding.

## 2017-06-18 ENCOUNTER — Ambulatory Visit (INDEPENDENT_AMBULATORY_CARE_PROVIDER_SITE_OTHER): Payer: Managed Care, Other (non HMO) | Admitting: Obstetrics & Gynecology

## 2017-06-18 ENCOUNTER — Encounter: Payer: Self-pay | Admitting: Obstetrics & Gynecology

## 2017-06-18 VITALS — BP 140/80 | HR 76 | Wt 265.3 lb

## 2017-06-18 DIAGNOSIS — Z331 Pregnant state, incidental: Secondary | ICD-10-CM

## 2017-06-18 DIAGNOSIS — Z3482 Encounter for supervision of other normal pregnancy, second trimester: Secondary | ICD-10-CM

## 2017-06-18 DIAGNOSIS — Z1389 Encounter for screening for other disorder: Secondary | ICD-10-CM

## 2017-06-18 DIAGNOSIS — Z3A23 23 weeks gestation of pregnancy: Secondary | ICD-10-CM

## 2017-06-18 DIAGNOSIS — Z3402 Encounter for supervision of normal first pregnancy, second trimester: Secondary | ICD-10-CM

## 2017-06-18 LAB — POCT URINALYSIS DIPSTICK
Blood, UA: NEGATIVE
Glucose, UA: NEGATIVE
LEUKOCYTES UA: NEGATIVE
NITRITE UA: NEGATIVE
PROTEIN UA: NEGATIVE

## 2017-06-18 NOTE — Progress Notes (Signed)
G2P0010 6021w0d Estimated Date of Delivery: 10/15/17  Blood pressure 140/80, pulse 76, weight 265 lb 4.8 oz (120.3 kg), last menstrual period 01/08/2017.   BP weight and urine results all reviewed and noted.  Please refer to the obstetrical flow sheet for the fundal height and fetal heart rate documentation:  Patient reports good fetal movement, denies any bleeding and no rupture of membranes symptoms or regular contractions. Patient is without complaints. All questions were answered.  Orders Placed This Encounter  Procedures  . POCT urinalysis dipstick    Plan:  Continued routine obstetrical care, PN2 next week  Return in about 4 weeks (around 07/16/2017) for PN2,, LROB.

## 2017-06-22 ENCOUNTER — Telehealth: Payer: Self-pay | Admitting: Women's Health

## 2017-06-22 NOTE — Telephone Encounter (Signed)
Pt called stating that she has been having some lower abdominal pains when she goes from sitting to standing. No c/o bleeding. She states that she feels baby moving normally. I informed pt that it sounds like round ligament pain. I advised pt to try changing positions more slowly. I advised her to call us back if any new issues arise or if s/s worsen. Pt verbalized understanding.

## 2017-06-29 ENCOUNTER — Encounter: Payer: Self-pay | Admitting: Obstetrics & Gynecology

## 2017-07-01 ENCOUNTER — Telehealth: Payer: Self-pay | Admitting: *Deleted

## 2017-07-01 NOTE — Telephone Encounter (Signed)
Patient called with complaints of nausea and vomiting Monday, then diarrhea yesterday. Had intercourse then felt sick and noticed some light pink spotting when she went to the bathroom. She has not had any spotting since then. Since it only occurred 1 time advised patient to push fluids and continue to monitor for bleeding, leaking or change in fetal movement to call us back. Verbalized understanding.

## 2017-07-14 ENCOUNTER — Encounter: Payer: Self-pay | Admitting: Obstetrics & Gynecology

## 2017-07-14 ENCOUNTER — Other Ambulatory Visit: Payer: Managed Care, Other (non HMO)

## 2017-07-14 ENCOUNTER — Ambulatory Visit (INDEPENDENT_AMBULATORY_CARE_PROVIDER_SITE_OTHER): Payer: Managed Care, Other (non HMO) | Admitting: Obstetrics & Gynecology

## 2017-07-14 VITALS — BP 136/88 | HR 92 | Wt 266.0 lb

## 2017-07-14 DIAGNOSIS — Z3A26 26 weeks gestation of pregnancy: Secondary | ICD-10-CM

## 2017-07-14 DIAGNOSIS — Z3402 Encounter for supervision of normal first pregnancy, second trimester: Secondary | ICD-10-CM

## 2017-07-14 DIAGNOSIS — Z131 Encounter for screening for diabetes mellitus: Secondary | ICD-10-CM

## 2017-07-14 DIAGNOSIS — Z331 Pregnant state, incidental: Secondary | ICD-10-CM

## 2017-07-14 DIAGNOSIS — Z1389 Encounter for screening for other disorder: Secondary | ICD-10-CM

## 2017-07-14 DIAGNOSIS — Z3482 Encounter for supervision of other normal pregnancy, second trimester: Secondary | ICD-10-CM

## 2017-07-14 LAB — POCT URINALYSIS DIPSTICK
Glucose, UA: NEGATIVE
KETONES UA: NEGATIVE
Nitrite, UA: NEGATIVE
Protein, UA: NEGATIVE

## 2017-07-14 MED ORDER — CLOTRIMAZOLE-BETAMETHASONE 1-0.05 % EX CREA: 1.0000 "application " | TOPICAL_CREAM | Freq: Two times a day (BID) | CUTANEOUS | 1 refills | Status: DC

## 2017-07-14 NOTE — Progress Notes (Signed)
G2P0010 2560w5d Estimated Date of Delivery: 10/15/17  Blood pressure 136/88, pulse 92, weight 266 lb (120.7 kg), last menstrual period 01/08/2017.   BP weight and urine results all reviewed and noted.  Please refer to the obstetrical flow sheet for the fundal height and fetal heart rate documentation:  Patient reports good fetal movement, denies any bleeding and no rupture of membranes symptoms or regular contractions. Patient is without complaints. All questions were answered.  Orders Placed This Encounter  Procedures  . POCT urinalysis dipstick   Meds ordered this encounter  Medications  . clotrimazole-betamethasone (LOTRISONE) cream    Sig: Apply 1 application topically 2 (two) times daily.    Dispense:  30 g    Refill:  1     Plan:  Continued routine obstetrical care, BP is creeping up a bit,  PN2  Return in about 3 weeks (around 08/04/2017).

## 2017-07-15 ENCOUNTER — Encounter: Payer: Managed Care, Other (non HMO) | Admitting: Advanced Practice Midwife

## 2017-07-15 ENCOUNTER — Other Ambulatory Visit: Payer: Managed Care, Other (non HMO)

## 2017-07-15 LAB — ANTIBODY SCREEN: Antibody Screen: NEGATIVE

## 2017-07-15 LAB — CBC
Hematocrit: 35.1 % (ref 34.0–46.6)
Hemoglobin: 11.5 g/dL (ref 11.1–15.9)
MCH: 31.7 pg (ref 26.6–33.0)
MCHC: 32.8 g/dL (ref 31.5–35.7)
MCV: 97 fL (ref 79–97)
PLATELETS: 293 10*3/uL (ref 150–379)
RBC: 3.63 x10E6/uL — AB (ref 3.77–5.28)
RDW: 14.2 % (ref 12.3–15.4)
WBC: 10.5 10*3/uL (ref 3.4–10.8)

## 2017-07-15 LAB — GLUCOSE TOLERANCE, 2 HOURS W/ 1HR
GLUCOSE, 1 HOUR: 219 mg/dL — AB (ref 65–179)
GLUCOSE, 2 HOUR: 201 mg/dL — AB (ref 65–152)
GLUCOSE, FASTING: 121 mg/dL — AB (ref 65–91)

## 2017-07-15 LAB — HIV ANTIBODY (ROUTINE TESTING W REFLEX): HIV SCREEN 4TH GENERATION: NONREACTIVE

## 2017-07-15 LAB — RPR: RPR: NONREACTIVE

## 2017-07-16 ENCOUNTER — Encounter: Payer: Self-pay | Admitting: Women's Health

## 2017-07-16 ENCOUNTER — Encounter: Payer: Self-pay | Admitting: Obstetrics & Gynecology

## 2017-07-16 ENCOUNTER — Telehealth: Payer: Self-pay | Admitting: *Deleted

## 2017-07-16 ENCOUNTER — Other Ambulatory Visit: Payer: Self-pay | Admitting: Obstetrics & Gynecology

## 2017-07-16 ENCOUNTER — Telehealth: Payer: Self-pay | Admitting: Obstetrics & Gynecology

## 2017-07-16 DIAGNOSIS — O24419 Gestational diabetes mellitus in pregnancy, unspecified control: Secondary | ICD-10-CM | POA: Insufficient documentation

## 2017-07-16 MED ORDER — METFORMIN HCL 500 MG PO TABS: 500.0000 mg | ORAL_TABLET | Freq: Two times a day (BID) | ORAL | 3 refills | Status: DC

## 2017-07-16 NOTE — Telephone Encounter (Signed)
Patient called stating that she received a message from Dr. Despina HiddenEure today regarding her Glucola test. Pt has some question for him. Please contact pt

## 2017-07-16 NOTE — Telephone Encounter (Signed)
Patient states wants to know if she will be diabetic after baby is born. Informed patient that she will be tested again after the baby is born. Also informed patient that referral was sent today so if she did not hear from them in a week to let me know.  Verbalized understanding.

## 2017-07-16 NOTE — Telephone Encounter (Signed)
Pharmacy didn't receive order for Metformin  BID so I gave a verbal. JSY

## 2017-07-24 DIAGNOSIS — Z029 Encounter for administrative examinations, unspecified: Secondary | ICD-10-CM

## 2017-07-28 ENCOUNTER — Telehealth: Payer: Self-pay | Admitting: *Deleted

## 2017-07-28 NOTE — Telephone Encounter (Signed)
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 Spoke with Dr Despina Hidden who stated patient did not need any thing at this time. Pt informed.

## 2017-07-29 ENCOUNTER — Encounter: Payer: Managed Care, Other (non HMO) | Attending: Advanced Practice Midwife | Admitting: Registered"

## 2017-07-29 ENCOUNTER — Telehealth: Payer: Self-pay | Admitting: *Deleted

## 2017-07-29 DIAGNOSIS — O24419 Gestational diabetes mellitus in pregnancy, unspecified control: Secondary | ICD-10-CM | POA: Diagnosis not present

## 2017-07-29 DIAGNOSIS — Z3A00 Weeks of gestation of pregnancy not specified: Secondary | ICD-10-CM | POA: Insufficient documentation

## 2017-07-29 DIAGNOSIS — Z713 Dietary counseling and surveillance: Secondary | ICD-10-CM | POA: Insufficient documentation

## 2017-07-29 NOTE — Telephone Encounter (Signed)
One Touch Verio Flex (meter,lancets, and strips) called to pharmacy. Patient is to check blood sugar 4 times daily with PRN refills.

## 2017-07-30 ENCOUNTER — Encounter: Payer: Self-pay | Admitting: Registered"

## 2017-07-30 NOTE — Progress Notes (Signed)
Patient was seen on 07/29/2017 for Gestational Diabetes self-management class at the [facility]. The following learning objectives were met by the patient during this course:   States the definition of Gestational Diabetes  States why dietary management is important in controlling blood glucose  Describes the effects each nutrient has on blood glucose levels  Demonstrates ability to create a balanced meal plan  Demonstrates carbohydrate counting   States when to check blood glucose levels  Demonstrates proper blood glucose monitoring techniques  States the effect of stress and exercise on blood glucose levels  States the importance of limiting caffeine and abstaining from alcohol and smoking  Blood glucose monitor given: [employer] Lot # [id number] X Exp: 11/02/18 Blood glucose reading: 77  Patient instructed to monitor glucose levels: FBS: 60 - <95 1 hour: <140 2 hour: <120  Patient received handouts:  Nutrition Diabetes and Pregnancy  Carbohydrate Counting List  Patient will be seen for follow-up as needed.

## 2017-07-31 ENCOUNTER — Telehealth: Payer: Self-pay | Admitting: Registered"

## 2017-07-31 NOTE — Telephone Encounter (Signed)
Pt was in GDM class 2 days ago. May be having dawn effect and RD reviewed dinner and bedtime snack strategies to bring it down.

## 2017-08-02 ENCOUNTER — Encounter: Payer: Self-pay | Admitting: Obstetrics & Gynecology

## 2017-08-04 ENCOUNTER — Encounter: Payer: Self-pay | Admitting: Obstetrics & Gynecology

## 2017-08-04 ENCOUNTER — Ambulatory Visit (INDEPENDENT_AMBULATORY_CARE_PROVIDER_SITE_OTHER): Payer: Managed Care, Other (non HMO) | Admitting: Obstetrics & Gynecology

## 2017-08-04 VITALS — BP 138/82 | HR 99 | Wt 259.0 lb

## 2017-08-04 DIAGNOSIS — Z331 Pregnant state, incidental: Secondary | ICD-10-CM

## 2017-08-04 DIAGNOSIS — Z3A29 29 weeks gestation of pregnancy: Secondary | ICD-10-CM

## 2017-08-04 DIAGNOSIS — O0993 Supervision of high risk pregnancy, unspecified, third trimester: Secondary | ICD-10-CM

## 2017-08-04 DIAGNOSIS — Z1389 Encounter for screening for other disorder: Secondary | ICD-10-CM

## 2017-08-04 DIAGNOSIS — O24419 Gestational diabetes mellitus in pregnancy, unspecified control: Secondary | ICD-10-CM

## 2017-08-04 LAB — POCT URINALYSIS DIPSTICK
GLUCOSE UA: NEGATIVE
Nitrite, UA: NEGATIVE
PROTEIN UA: NEGATIVE

## 2017-08-04 NOTE — Progress Notes (Signed)
Fetal Surveillance Testing today:  FHR 146   High Risk Pregnancy Diagnosis(es):   Class a 2 diabetes  G2P0010 [redacted]w[redacted]d Estimated Date of Delivery: 10/15/17  Blood pressure 138/82, pulse 99, weight 259 lb (117.5 kg), last menstrual period 01/08/2017.  Urinalysis: Negative   HPI: The patient is being seen today for ongoing management of as above. Today she reports CBGs are not perfect but they're pretty decent on to change her medication right now she is doing a great job with her diet because she's lost 7 pounds   BP weight and urine results all reviewed and noted. Patient reports good fetal movement, denies any bleeding and no rupture of membranes symptoms or regular contractions.  Fundal Height:  34 Fetal Heart rate:  147 Edema:  none  Patient is without complaints other than noted in her HPI. All questions were answered.  All lab and sonogram results have been reviewed. Comments:    Assessment:  1.  Pregnancy at [redacted]w[redacted]d,  Estimated Date of Delivery: 10/15/17 :                          2.  Class A2 diiabetes improving control                        3.  Watching blood pressure  Medication(s) Plans:  Metformin 500 twice a day  Treatment Plan:  I will see Vaeda back in 2 weeks to see how her blood sugars are doing and then of course at 32 weeks we will begin twice weekly NSTs with fetal weight ultrasounds at 32 and 36 weeks.  Of course watching her blood pressure she sort of his of borderline person.  And we discussed that as well  Return in about 2 weeks (around 08/18/2017) for HROB, with Dr Despina Hidden. for appointment for high risk OB care  No orders of the defined types were placed in this encounter.  Orders Placed This Encounter  Procedures  . POCT urinalysis dipstick

## 2017-08-06 ENCOUNTER — Telehealth: Payer: Self-pay | Admitting: *Deleted

## 2017-08-06 NOTE — Telephone Encounter (Signed)
Patient called stating she has felt intermittent sharp shooting pain in her vagina. She is not bleeding, leaking or having any contractions. Baby is moving. Informed patient that these pains can be normal but if contractions or cramping begin to let us know. Verbalized understanding.

## 2017-08-18 ENCOUNTER — Encounter: Payer: Managed Care, Other (non HMO) | Admitting: Obstetrics & Gynecology

## 2017-08-18 ENCOUNTER — Encounter: Payer: Self-pay | Admitting: Obstetrics & Gynecology

## 2017-08-18 ENCOUNTER — Ambulatory Visit (INDEPENDENT_AMBULATORY_CARE_PROVIDER_SITE_OTHER): Payer: Managed Care, Other (non HMO) | Admitting: Obstetrics & Gynecology

## 2017-08-18 VITALS — BP 120/80 | HR 103 | Wt 258.0 lb

## 2017-08-18 DIAGNOSIS — Z3A31 31 weeks gestation of pregnancy: Secondary | ICD-10-CM

## 2017-08-18 DIAGNOSIS — Z1389 Encounter for screening for other disorder: Secondary | ICD-10-CM

## 2017-08-18 DIAGNOSIS — O24419 Gestational diabetes mellitus in pregnancy, unspecified control: Secondary | ICD-10-CM

## 2017-08-18 DIAGNOSIS — O24415 Gestational diabetes mellitus in pregnancy, controlled by oral hypoglycemic drugs: Secondary | ICD-10-CM

## 2017-08-18 DIAGNOSIS — Z331 Pregnant state, incidental: Secondary | ICD-10-CM

## 2017-08-18 DIAGNOSIS — O0993 Supervision of high risk pregnancy, unspecified, third trimester: Secondary | ICD-10-CM

## 2017-08-18 LAB — POCT URINALYSIS DIPSTICK
Glucose, UA: NEGATIVE
Nitrite, UA: NEGATIVE

## 2017-08-18 NOTE — Progress Notes (Signed)
Fetal Surveillance Testing today:  FHR 145   High Risk Pregnancy Diagnosis(es):   Class A2 DM  G2P0010 [redacted]w[redacted]d Estimated Date of Delivery: 10/15/17  Blood pressure 120/80, pulse (!) 103, weight 258 lb (117 kg), last menstrual period 01/08/2017.  Urinalysis: Negative   HPI: The patient is being seen today for ongoing management of as above CBGs are reviewed today and are found to be normal much improved over previous evaluations Today she reports as above   BP weight and urine results all reviewed and noted. Patient reports good fetal movement, denies any bleeding and no rupture of membranes symptoms or regular contractions.  Fundal Height:  34 Fetal Heart rate:  145 Edema:  none  Patient is without complaints other than noted in her HPI. All questions were answered.  All lab and sonogram results have been reviewed. Comments:    Assessment:  1.  Pregnancy at [redacted]w[redacted]d,  Estimated Date of Delivery: 10/15/17 :                          2.  Class A2 DM                        3.    Medication(s) Plans: Metformin 500 mg twice a day  Treatment Plan: Begin twice weekly testing next week, will do an estimated fetal weight at 32 weeks with a biophysical profile then twice weekly NSTs and a repeat ultrasound at 36 weeks again to evaluate interval growth, and of course the plan will be to deliver at 39 weeks unless otherwise clinically indicated.  Return in about 6 days (around 08/24/2017) for BPP/sono, HROB, with Dr Despina Hidden. for appointment for high risk OB care  No orders of the defined types were placed in this encounter.  Orders Placed This Encounter  Procedures  . US OB Follow Up  . US Fetal BPP W/O Non Stress  . POCT urinalysis dipstick

## 2017-08-24 ENCOUNTER — Ambulatory Visit (INDEPENDENT_AMBULATORY_CARE_PROVIDER_SITE_OTHER): Payer: Managed Care, Other (non HMO) | Admitting: Obstetrics & Gynecology

## 2017-08-24 ENCOUNTER — Encounter: Payer: Self-pay | Admitting: Obstetrics & Gynecology

## 2017-08-24 ENCOUNTER — Ambulatory Visit (INDEPENDENT_AMBULATORY_CARE_PROVIDER_SITE_OTHER): Payer: Managed Care, Other (non HMO)

## 2017-08-24 VITALS — BP 124/82 | HR 95 | Wt 258.0 lb

## 2017-08-24 DIAGNOSIS — O24419 Gestational diabetes mellitus in pregnancy, unspecified control: Secondary | ICD-10-CM | POA: Diagnosis not present

## 2017-08-24 DIAGNOSIS — Z331 Pregnant state, incidental: Secondary | ICD-10-CM

## 2017-08-24 DIAGNOSIS — Z3A32 32 weeks gestation of pregnancy: Secondary | ICD-10-CM | POA: Diagnosis not present

## 2017-08-24 DIAGNOSIS — O0993 Supervision of high risk pregnancy, unspecified, third trimester: Secondary | ICD-10-CM

## 2017-08-24 DIAGNOSIS — Z1389 Encounter for screening for other disorder: Secondary | ICD-10-CM

## 2017-08-24 DIAGNOSIS — O24415 Gestational diabetes mellitus in pregnancy, controlled by oral hypoglycemic drugs: Secondary | ICD-10-CM

## 2017-08-24 DIAGNOSIS — O0990 Supervision of high risk pregnancy, unspecified, unspecified trimester: Secondary | ICD-10-CM

## 2017-08-24 LAB — POCT URINALYSIS DIPSTICK
GLUCOSE UA: NEGATIVE
NITRITE UA: NEGATIVE
RBC UA: NEGATIVE

## 2017-08-24 NOTE — Progress Notes (Signed)
US 32+4 wks,cephalic,post pl gr 0,normal ovaries bilat,BPP 8/8,FHR 146 BPM,LVEICF n/c,AFI 15.6 cm,EFW 2340 69%

## 2017-08-24 NOTE — Progress Notes (Signed)
Fetal Surveillance Testing today:  BPP 8/8   High Risk Pregnancy Diagnosis(es):   Class A2 DM  G2P0010 4151w4d Estimated Date of Delivery: 10/15/17  Blood pressure 124/82, pulse 95, weight 258 lb (117 kg), last menstrual period 01/08/2017.  Urinalysis: Negative   HPI: The patient is being seen today for ongoing management of Class A2 DM. Today she reports CBG are good   BP weight and urine results all reviewed and noted. Patient reports good fetal movement, denies any bleeding and no rupture of membranes symptoms or regular contractions.  Fundal Height:  36  Fetal Heart rate:  146 Edema:  none  Patient is without complaints other than noted in her HPI. All questions were answered.  All lab and sonogram results have been reviewed. Comments:    Assessment:  1.  Pregnancy at 9551w4d,  Estimated Date of Delivery: 10/15/17 :                          2.  Class A2 DM                        3.    Medication(s) Plans:  contiue metformin 500 BID  Treatment Plan:  Twice weekly NST, repeat EFW 36 weeks, currently at 69% with hed abdomen at 90%  Return in about 3 days (around 08/27/2017) for NST, HROB, with Dr Despina HiddenEure. for appointment for high risk OB care  No orders of the defined types were placed in this encounter.  Orders Placed This Encounter  Procedures  . POCT urinalysis dipstick

## 2017-08-27 ENCOUNTER — Ambulatory Visit (INDEPENDENT_AMBULATORY_CARE_PROVIDER_SITE_OTHER): Payer: Managed Care, Other (non HMO) | Admitting: Obstetrics & Gynecology

## 2017-08-27 ENCOUNTER — Other Ambulatory Visit: Payer: Managed Care, Other (non HMO) | Admitting: Obstetrics & Gynecology

## 2017-08-27 ENCOUNTER — Encounter: Payer: Self-pay | Admitting: Obstetrics & Gynecology

## 2017-08-27 VITALS — BP 130/80 | HR 98 | Wt 257.5 lb

## 2017-08-27 DIAGNOSIS — O0990 Supervision of high risk pregnancy, unspecified, unspecified trimester: Secondary | ICD-10-CM

## 2017-08-27 DIAGNOSIS — Z331 Pregnant state, incidental: Secondary | ICD-10-CM

## 2017-08-27 DIAGNOSIS — Z3A33 33 weeks gestation of pregnancy: Secondary | ICD-10-CM | POA: Diagnosis not present

## 2017-08-27 DIAGNOSIS — O24415 Gestational diabetes mellitus in pregnancy, controlled by oral hypoglycemic drugs: Secondary | ICD-10-CM

## 2017-08-27 DIAGNOSIS — O24419 Gestational diabetes mellitus in pregnancy, unspecified control: Secondary | ICD-10-CM

## 2017-08-27 DIAGNOSIS — Z1389 Encounter for screening for other disorder: Secondary | ICD-10-CM

## 2017-08-27 LAB — POCT URINALYSIS DIPSTICK
Glucose, UA: NEGATIVE
NITRITE UA: NEGATIVE
Protein, UA: NEGATIVE

## 2017-08-27 MED ORDER — OMEPRAZOLE 20 MG PO CPDR: 20.0000 mg | DELAYED_RELEASE_CAPSULE | Freq: Every day | ORAL | 6 refills | Status: DC

## 2017-08-27 MED ORDER — PROMETHAZINE HCL 12.5 MG PO TABS: 12.5000 mg | ORAL_TABLET | Freq: Four times a day (QID) | ORAL | 1 refills | Status: DC | PRN

## 2017-08-27 NOTE — Progress Notes (Signed)
Fetal Surveillance Testing today:  Reactive NST   High Risk Pregnancy Diagnosis(es):   Class A2 DM  G2P0010 4447w0d Estimated Date of Delivery: 10/15/17  Blood pressure 130/80, pulse 98, weight 257 lb 8 oz (116.8 kg), last menstrual period 01/08/2017.  Urinalysis: Negative   HPI: The patient is being seen today for ongoing management of Class A2 DM. Today she reports CBG are    BP weight and urine results all reviewed and noted. Patient reports good fetal movement, denies any bleeding and no rupture of membranes symptoms or regular contractions.  Fundal Height:  39 cm Fetal Heart rate:  140 Edema:  none  Patient is without complaints other than noted in her HPI. All questions were answered.  All lab and sonogram results have been reviewed. Comments:    Assessment:  1.  Pregnancy at 5547w0d,  Estimated Date of Delivery: 10/15/17 :                          2.  Class A2 DM stable                        3.    Medication(s) Plans: Continue Metformin 500 mg in the morning and thousand milligrams at night  Treatment Plan: Continue twice surveillance with a sonogram needed at 36 weeks otherwise twice weekly NSTs and planned induction of labor with delivery at 39 weeks unless otherwise clinically indicated, she is also having some postprandial nausea and vomiting on given 12-1/2 mg of Phenergan to take prenatal in case this is related to gallbladder dysfunction which I am suspicious of, she is also given a prescription for Prilosec as she is also having reflux  Return in about 5 days (around 09/01/2017) for NST, HROB. for appointment for high risk OB care  Meds ordered this encounter  Medications  . omeprazole (PRILOSEC) 20 MG capsule    Sig: Take 1 capsule (20 mg total) by mouth daily. 1 tablet a day    Dispense:  30 capsule    Refill:  6  . promethazine (PHENERGAN) 12.5 MG tablet    Sig: Take 1 tablet (12.5 mg total) by mouth every 6 (six) hours as needed for nausea or vomiting.   Dispense:  30 tablet    Refill:  1   Orders Placed This Encounter  Procedures  . POCT urinalysis dipstick

## 2017-09-01 ENCOUNTER — Ambulatory Visit (INDEPENDENT_AMBULATORY_CARE_PROVIDER_SITE_OTHER): Payer: Managed Care, Other (non HMO) | Admitting: Women's Health

## 2017-09-01 ENCOUNTER — Encounter: Payer: Self-pay | Admitting: Women's Health

## 2017-09-01 VITALS — BP 120/60 | HR 88 | Wt 260.0 lb

## 2017-09-01 DIAGNOSIS — Z23 Encounter for immunization: Secondary | ICD-10-CM | POA: Diagnosis not present

## 2017-09-01 DIAGNOSIS — O24419 Gestational diabetes mellitus in pregnancy, unspecified control: Secondary | ICD-10-CM

## 2017-09-01 DIAGNOSIS — O24415 Gestational diabetes mellitus in pregnancy, controlled by oral hypoglycemic drugs: Secondary | ICD-10-CM

## 2017-09-01 DIAGNOSIS — Z3A33 33 weeks gestation of pregnancy: Secondary | ICD-10-CM | POA: Diagnosis not present

## 2017-09-01 DIAGNOSIS — Z331 Pregnant state, incidental: Secondary | ICD-10-CM | POA: Diagnosis not present

## 2017-09-01 DIAGNOSIS — O0993 Supervision of high risk pregnancy, unspecified, third trimester: Secondary | ICD-10-CM

## 2017-09-01 DIAGNOSIS — O0990 Supervision of high risk pregnancy, unspecified, unspecified trimester: Secondary | ICD-10-CM

## 2017-09-01 DIAGNOSIS — Z1389 Encounter for screening for other disorder: Secondary | ICD-10-CM

## 2017-09-01 LAB — POCT URINALYSIS DIPSTICK
Blood, UA: NEGATIVE
GLUCOSE UA: NEGATIVE
KETONES UA: NEGATIVE
LEUKOCYTES UA: NEGATIVE
NITRITE UA: NEGATIVE
Protein, UA: NEGATIVE

## 2017-09-01 NOTE — Patient Instructions (Addendum)
Tiffany Dyer, I greatly value your feedback.  If you receive a survey following your visit with [place] today, we appreciate you taking the time to fill it out.  Thanks, [name], CNM, WHNP-BC  Increase your metformin to 1,000mg  (2 pills) at night, continue metformin 500mg  (1 pill) every morning   Call the office [phone]) or go to [facility] if:  You begin to have strong, frequent contractions  Your water breaks.  Sometimes it is a big gush of fluid, sometimes it is just a trickle that keeps getting your panties wet or running down your legs  You have vaginal bleeding.  It is normal to have a small amount of spotting if your cervix was checked.   You don't feel your baby moving like normal.  If you don't, get you something to eat and drink and lay down and focus on feeling your baby move.  You should feel at least 10 movements in 2 hours.  If you don't, you should call the office or go to [facility].    Tdap Vaccine  It is recommended that you get the Tdap vaccine during the third trimester of EACH pregnancy to help protect your baby from getting pertussis (whooping cough)  27-36 weeks is the BEST time to do this so that you can pass the protection on to your baby. During pregnancy is better than after pregnancy, but if you are unable to get it during pregnancy it will be offered at the hospital.   You can get this vaccine at the health department or your family doctor  Everyone who will be around your baby should also be up-to-date on their vaccines. Adults (who are not pregnant) only need 1 dose of Tdap during adulthood.   [place] Pediatricians/Family Doctors:  [facility] [phone]            [facility] [phone]                 [facility] [phone] (usually not accepting new patients unless you have family there already, you are always welcome to call and ask)       [facility] Department  [phone]       [facility] Pediatricians/Family Doctors:   [facility]: [phone]  [facility]: [phone]  [facility]: [phone]  [facility] Doctors:   [facility]: [phone]   [facility]: [phone]  [facility] Doctors:  [facility]: [phone]    Third Trimester of Pregnancy The third trimester is from week 29 through week 42, months 7 through 9. The third trimester is a time when the fetus is growing rapidly. At the end of the ninth month, the fetus is about 20 inches in length and weighs 6-10 pounds.  BODY CHANGES Your body goes through many changes during pregnancy. The changes vary from woman to woman.   Your weight will continue to increase. You can expect to gain 25-35 pounds (11-16 kg) by the end of the pregnancy.  You may begin to get stretch marks on your hips, abdomen, and breasts.  You may urinate more often because the fetus is moving lower into your pelvis and pressing on your bladder.  You may develop or continue to have heartburn as a result of your pregnancy.  You may develop constipation because certain hormones are causing the muscles that push waste through your intestines to slow down.  You may develop hemorrhoids or swollen, bulging veins (varicose veins).  You may have pelvic pain because of the weight gain and pregnancy hormones relaxing your joints between the bones in your pelvis. Backaches may result from overexertion of the muscles supporting your posture.  You may have changes in your hair. These can include thickening of your hair, rapid growth, and changes in texture. Some women also have hair loss during or after pregnancy, or hair that feels dry or thin. Your hair will most likely return to normal after your baby is born.  Your breasts will continue to grow and be tender. A yellow discharge may leak from your breasts called  colostrum.  Your belly button may stick out.  You may feel short of breath because of your expanding uterus.  You may notice the fetus "dropping," or moving lower in your abdomen.  You may have a bloody mucus discharge. This usually occurs a few days to a week before labor begins.  Your cervix becomes thin and soft (effaced) near your due date. WHAT TO EXPECT AT YOUR PRENATAL EXAMS  You will have prenatal exams every 2 weeks until week 36. Then, you will have weekly prenatal exams. During a routine prenatal visit:  You will be weighed to make sure you and the fetus are growing normally.  Your blood pressure is taken.  Your abdomen will be measured to track your baby's growth.  The fetal heartbeat will be listened to.  Any test results from the previous visit will be discussed.  You may have a cervical check near your due date to see if you have effaced. At around 36 weeks, your caregiver will check your cervix. At the same time, your caregiver will also perform a test on the secretions of the vaginal tissue. This test is to determine if a type of bacteria, Group B streptococcus, is present. Your caregiver will explain this further. Your caregiver may ask you:  What your birth plan is.  How you are feeling.  If you are feeling the baby move.  If you have had any abnormal symptoms, such as leaking fluid, bleeding, severe headaches, or abdominal cramping.  If you have any questions. Other tests or screenings that may be performed during your third trimester include:  Blood tests that check for low iron levels (anemia).  Fetal testing to check the health, activity level, and growth of the fetus. Testing is done if you have certain medical conditions or if there are problems during the pregnancy. FALSE LABOR You may feel small, irregular contractions that eventually go away. These are called Braxton Hicks contractions, or false labor. Contractions may last for hours, days, or  even weeks before true labor sets in. If contractions come at regular intervals, intensify, or become painful, it is best to be seen by your caregiver.  SIGNS OF LABOR   Menstrual-like cramps.  Contractions that are 5 minutes apart or less.  Contractions that start on the top of the uterus and spread down to the lower abdomen and back.  A sense of increased pelvic pressure or back pain.  A watery or bloody mucus discharge that comes from the vagina. If you have any of these signs before the 37th week of pregnancy, call your caregiver right away. You need to go to the hospital to get checked immediately. HOME CARE INSTRUCTIONS   Avoid all smoking, herbs, alcohol, and unprescribed drugs. These chemicals affect the formation and growth of the baby.  Follow your caregiver's instructions regarding medicine use. There are medicines that are either safe or  unsafe to take during pregnancy.  Exercise only as directed by your caregiver. Experiencing uterine cramps is a good sign to stop exercising.  Continue to eat regular, healthy meals.  Wear a good support bra for breast tenderness.  Do not use hot tubs, steam rooms, or saunas.  Wear your seat belt at all times when driving.  Avoid raw meat, uncooked cheese, cat litter boxes, and soil used by cats. These carry germs that can cause birth defects in the baby.  Take your prenatal vitamins.  Try taking a stool softener (if your caregiver approves) if you develop constipation. Eat more high-fiber foods, such as fresh vegetables or fruit and whole grains. Drink plenty of fluids to keep your urine clear or pale yellow.  Take warm sitz baths to soothe any pain or discomfort caused by hemorrhoids. Use hemorrhoid cream if your caregiver approves.  If you develop varicose veins, wear support hose. Elevate your feet for 15 minutes, 3-4 times a day. Limit salt in your diet.  Avoid heavy lifting, wear low heal shoes, and practice good  posture.  Rest a lot with your legs elevated if you have leg cramps or low back pain.  Visit your dentist if you have not gone during your pregnancy. Use a soft toothbrush to brush your teeth and be gentle when you floss.  A sexual relationship may be continued unless your caregiver directs you otherwise.  Do not travel far distances unless it is absolutely necessary and only with the approval of your caregiver.  Take prenatal classes to understand, practice, and ask questions about the labor and delivery.  Make a trial run to the hospital.  Pack your hospital bag.  Prepare the baby's nursery.  Continue to go to all your prenatal visits as directed by your caregiver. SEEK MEDICAL CARE IF:  You are unsure if you are in labor or if your water has broken.  You have dizziness.  You have mild pelvic cramps, pelvic pressure, or nagging pain in your abdominal area.  You have persistent nausea, vomiting, or diarrhea.  You have a bad smelling vaginal discharge.  You have pain with urination. SEEK IMMEDIATE MEDICAL CARE IF:   You have a fever.  You are leaking fluid from your vagina.  You have spotting or bleeding from your vagina.  You have severe abdominal cramping or pain.  You have rapid weight loss or gain.  You have shortness of breath with chest pain.  You notice sudden or extreme swelling of your face, hands, ankles, feet, or legs.  You have not felt your baby move in over an hour.  You have severe headaches that do not go away with medicine.  You have vision changes. Document Released: 10/14/2001 Document Revised: 10/25/2013 Document Reviewed: 12/21/2012 [facility] Patient Information 2015 [place], [place]. This information is not intended to replace advice given to you by your health care provider. Make sure you discuss any questions you have with your health care provider.

## 2017-09-01 NOTE — Progress Notes (Signed)
   HIGH-RISK PREGNANCY VISIT Patient name: Tiffany Dyer MRN [id number]  Date of birth: 23-Dec-1985 Chief Complaint:   Routine Prenatal Visit  History of Present Illness:   Tiffany Dyer is a 31 y.o. 352P0010 female at 3319w5d with an Estimated Date of Delivery: 10/15/17 being seen today for ongoing management of a high-risk pregnancy complicated by A2DM- currently on metformin 500mg  BID.  Today she reports FBS all >90 (97-112), 2hr pp 86-162 (most <120, the 162 was after baby shower). Contractions: Irregular. Vag. Bleeding: None.  Movement: Present. denies leaking of fluid.  Review of Systems:   Pertinent items are noted in HPI Denies abnormal vaginal discharge w/ itching/odor/irritation, headaches, visual changes, shortness of breath, chest pain, abdominal pain, severe nausea/vomiting, or problems with urination or bowel movements unless otherwise stated above. Pertinent History Reviewed:  Reviewed past medical,surgical, social, obstetrical and family history.  Reviewed problem list, medications and allergies. Physical Assessment:   Vitals:   09/01/17 1522  BP: 120/60  Pulse: 88  Weight: 260 lb (117.9 kg)  Body mass index is 47.55 kg/m.           Physical Examination:   General appearance: alert, well appearing, and in no distress  Mental status: alert, oriented to person, place, and time  Skin: warm & dry   Extremities: Edema: None    Cardiovascular: normal heart rate noted  Respiratory: normal respiratory effort, no distress  Abdomen: gravid, soft, non-tender  Pelvic: Cervical exam deferred         Fetal Status: Fetal Heart Rate (bpm): 120 Fundal Height: 33 cm Movement: Present    Fetal Surveillance Testing today: NST: FHR baseline 120 bpm, Variability: moderate, Accelerations:present, Decelerations:  Absent= Cat 1/Reactive   Results for orders placed or performed in visit on 09/01/17 (from the past 24 hour(s))  POCT Urinalysis Dipstick   Collection Time: 09/01/17   3:34 PM  Result Value Ref Range   Color, UA     Clarity, UA     Glucose, UA Negative    Bilirubin, UA     Ketones, UA Negative    Spec Grav, UA  1.010 - 1.025   Blood, UA Negative    pH, UA  5.0 - 8.0   Protein, UA Negative    Urobilinogen, UA  0.2 or 1.0 E.U./dL   Nitrite, UA Negative    Leukocytes, UA Negative Negative    Assessment & Plan:  1) High-risk pregnancy G2P0010 at 3419w5d with an Estimated Date of Delivery: 10/15/17   2) A2DM, requiring med adjustment, increase pm metformin dose to 1,000mg  (2 pills), continue 500mg  AM  Labs/procedures today: flu shot, NST  Treatment Plan:  Growth u/s @ 36-38wks     2x/wk testing    Deliver @ 39wks  Reviewed: Preterm labor symptoms and general obstetric precautions including but not limited to vaginal bleeding, contractions, leaking of fluid and fetal movement were reviewed in detail with the patient.  All questions were answered.  Follow-up: Return for Friday for HROB/NST. Does not wish to schedule appts out in advance  Orders Placed This Encounter  Procedures  . POCT Urinalysis Dipstick   Marge DuncansBooker, Stewart Sasaki Randall CNM, [facility] 09/01/2017 4:10 PM

## 2017-09-04 ENCOUNTER — Other Ambulatory Visit: Payer: Self-pay | Admitting: *Deleted

## 2017-09-04 ENCOUNTER — Ambulatory Visit (INDEPENDENT_AMBULATORY_CARE_PROVIDER_SITE_OTHER): Payer: Managed Care, Other (non HMO) | Admitting: Obstetrics and Gynecology

## 2017-09-04 ENCOUNTER — Encounter: Payer: Self-pay | Admitting: Obstetrics and Gynecology

## 2017-09-04 VITALS — BP 130/84 | HR 87 | Wt 257.6 lb

## 2017-09-04 DIAGNOSIS — O24419 Gestational diabetes mellitus in pregnancy, unspecified control: Secondary | ICD-10-CM

## 2017-09-04 DIAGNOSIS — Z3A34 34 weeks gestation of pregnancy: Secondary | ICD-10-CM

## 2017-09-04 DIAGNOSIS — O0993 Supervision of high risk pregnancy, unspecified, third trimester: Secondary | ICD-10-CM

## 2017-09-04 DIAGNOSIS — Z1389 Encounter for screening for other disorder: Secondary | ICD-10-CM

## 2017-09-04 DIAGNOSIS — Z331 Pregnant state, incidental: Secondary | ICD-10-CM

## 2017-09-04 LAB — POCT URINALYSIS DIPSTICK
Glucose, UA: NEGATIVE
NITRITE UA: NEGATIVE
Protein, UA: NEGATIVE

## 2017-09-04 NOTE — Progress Notes (Signed)
Tiffany Dyer is a 31 y.o. female High Risk Pregnancy HROB Diagnosis(es):   A2DM- currently on metformin 500mg  BID.   G2P0010 7222w1d Estimated Date of Delivery: 10/15/17    HPI: The patient is being seen today for ongoing management of A2DM- currently on metformin 500mg  BID.   Today she reports no complaints. She has brought records of her BGL. She has started taking her increased metformin dose (night is 1000 mg, morning is 500 mg), and has noticed the side effect of diarrhea. She had an episode of diarrhea at 3-4AM last night. She took her night dose at [facility]. She states she had a stressful week.   Patient reports good fetal movement, denies any bleeding and no rupture of membranes symptoms or regular contractions.   BP weight and urine results reviewed and noted. Blood pressure 130/84, pulse 87, weight 257 lb 9.6 oz (116.8 kg), last menstrual period 01/08/2017.  Fundal Height:  34 Fetal Heart rate:  135 with accels to 155 Physical Examination: Abdomen - soft, nontender, nondistended, no masses or organomegaly                                     Pelvic - Not indicated                                     Edema:  None  Urinalysis: large ketones, small blood, 2+ leukocytes  Fetal Surveillance Testing today:  NST - Reactive  Lab and sonogram results have been reviewed.   Assessment:  1.  Pregnancy at 8322w1d,  G2P0010   :  Estimated Date of Delivery: 10/15/17                        2.  A2DM- currently on metformin 500mg  AM, 1000mg  PM                          Medication(s) Plans:  Metformin 500mg  AM, 1000mg  PM  Treatment Plan:  Growth u/s @ 36-38wks     2x/wk testing    Deliver @ 39wks Pt advised to increase physical activity in order to lower Metformin dosage, may try splitting PM dose by 2-3 hours for efficacy.l  Follow up in 3-4 days weeks for appointment for high risk OB care, NST A2DM , reaactive.    By signing my name below, I, Izna Ahmed, attest that this  documentation has been prepared under the direction and in the presence of Tilda BurrowFerguson, Matyas Baisley V, MD. Electronically Signed: Redge GainerIzna Ahmed, Medical Scribe. 09/04/17. 11:19 AM.  I personally performed the services described in this documentation, which was SCRIBED in my presence. The recorded information has been reviewed and considered accurate. It has been edited as necessary during review. Tilda BurrowFERGUSON,Karessa Onorato V, MD  '

## 2017-09-06 ENCOUNTER — Encounter: Payer: Self-pay | Admitting: Obstetrics & Gynecology

## 2017-09-06 MED ORDER — METFORMIN HCL 500 MG PO TABS: 500.0000 mg | ORAL_TABLET | Freq: Two times a day (BID) | ORAL | 3 refills | Status: DC

## 2017-09-07 ENCOUNTER — Ambulatory Visit (INDEPENDENT_AMBULATORY_CARE_PROVIDER_SITE_OTHER): Payer: Managed Care, Other (non HMO) | Admitting: Obstetrics & Gynecology

## 2017-09-07 ENCOUNTER — Encounter: Payer: Self-pay | Admitting: Obstetrics & Gynecology

## 2017-09-07 VITALS — BP 120/70 | HR 84 | Wt 258.4 lb

## 2017-09-07 DIAGNOSIS — Z3A34 34 weeks gestation of pregnancy: Secondary | ICD-10-CM | POA: Diagnosis not present

## 2017-09-07 DIAGNOSIS — Z1389 Encounter for screening for other disorder: Secondary | ICD-10-CM

## 2017-09-07 DIAGNOSIS — O24419 Gestational diabetes mellitus in pregnancy, unspecified control: Secondary | ICD-10-CM

## 2017-09-07 DIAGNOSIS — O0990 Supervision of high risk pregnancy, unspecified, unspecified trimester: Secondary | ICD-10-CM

## 2017-09-07 DIAGNOSIS — Z331 Pregnant state, incidental: Secondary | ICD-10-CM

## 2017-09-07 LAB — POCT URINALYSIS DIPSTICK
GLUCOSE UA: NEGATIVE
Leukocytes, UA: NEGATIVE
NITRITE UA: NEGATIVE
PROTEIN UA: NEGATIVE
RBC UA: NEGATIVE

## 2017-09-07 MED ORDER — ACYCLOVIR 400 MG PO TABS: 400.0000 mg | ORAL_TABLET | Freq: Three times a day (TID) | ORAL | 2 refills | Status: DC

## 2017-09-07 MED ORDER — METFORMIN HCL 500 MG PO TABS: ORAL_TABLET | ORAL | 1 refills | Status: DC

## 2017-09-07 NOTE — Progress Notes (Signed)
   HIGH-RISK PREGNANCY VISIT Patient name: Tiffany Dyer MRN [id number]  Date of birth: 07-20-1986 Chief Complaint:   High Risk Gestation (NST)  History of Present Illness:   Tiffany Dyer is a 31 y.o. 62P0010 female at 5197w4d with an Estimated Date of Delivery: 10/15/17 being seen today for ongoing management of a high-risk pregnancy complicated by Class A2 DM .  Today she reports no complaints. Contractions: Not present.  .  Movement: Present. denies leaking of fluid.  Review of Systems:   Pertinent items are noted in HPI Denies abnormal vaginal discharge w/ itching/odor/irritation, headaches, visual changes, shortness of breath, chest pain, abdominal pain, severe nausea/vomiting, or problems with urination or bowel movements unless otherwise stated above. Pertinent History Reviewed:  Reviewed past medical,surgical, social, obstetrical and family history.  Reviewed problem list, medications and allergies. Physical Assessment:   Vitals:   09/07/17 1509  BP: 120/70  Pulse: 84  Weight: 258 lb 6.4 oz (117.2 kg)  Body mass index is 47.26 kg/m.           Physical Examination:   General appearance: alert, well appearing, and in no distress  Mental status: alert, oriented to person, place, and time  Skin: warm & dry   Extremities: Edema: None    Cardiovascular: normal heart rate noted  Respiratory: normal respiratory effort, no distress  Abdomen: gravid, soft, non-tender  Pelvic: Cervical exam deferred         Fetal Status: Fetal Heart Rate (bpm): 135 Fundal Height: 38 cm Movement: Present    Fetal Surveillance Testing today: reactive NST  Results for orders placed or performed in visit on 09/07/17 (from the past 24 hour(s))  POCT urinalysis dipstick   Collection Time: 09/07/17  3:27 PM  Result Value Ref Range   Color, UA     Clarity, UA     Glucose, UA neg    Bilirubin, UA     Ketones, UA trace    Spec Grav, UA  1.010 - 1.025   Blood, UA neg    pH, UA  5.0 - 8.0     Protein, UA neg    Urobilinogen, UA  0.2 or 1.0 E.U./dL   Nitrite, UA neg    Leukocytes, UA Negative Negative    Meds ordered this encounter  Medications  . metFORMIN (GLUCOPHAGE) 500 MG tablet    Sig: 1 tablet with breakfast and 2 tablets with dinner    Dispense:  90 tablet    Refill:  1  . acyclovir (ZOVIRAX) 400 MG tablet    Sig: Take 1 tablet (400 mg total) 3 (three) times daily by mouth.    Dispense:  90 tablet    Refill:  2    Assessment & Plan:  1) High-risk pregnancy G2P0010 at 8497w4d with an Estimated Date of Delivery: 10/15/17   2) Class A2 DM, stable on Metformin 500 in am and 1000 pm  3) LV EICF, stable  Labs/procedures today: NST  Treatment Plan: Continue twice weekly nonstress test with an estimated fetal weight at 3637 weeks Anticipate delivery at 39 weeks unless otherwise clinically indicated  Reviewed: Preterm labor symptoms and general obstetric precautions including but not limited to vaginal bleeding, contractions, leaking of fluid and fetal movement were reviewed in detail with the patient.  All questions were answered.  Follow-up: Return in about 3 days (around 09/10/2017) for NST, HROB.  Orders Placed This Encounter  Procedures  . POCT urinalysis dipstick   EURE,LUTHER H  09/07/2017 4:19 PM

## 2017-09-10 ENCOUNTER — Encounter: Payer: Self-pay | Admitting: Obstetrics & Gynecology

## 2017-09-10 ENCOUNTER — Ambulatory Visit (INDEPENDENT_AMBULATORY_CARE_PROVIDER_SITE_OTHER): Payer: Managed Care, Other (non HMO) | Admitting: Obstetrics & Gynecology

## 2017-09-10 VITALS — BP 130/70 | HR 95 | Wt 258.0 lb

## 2017-09-10 DIAGNOSIS — O24419 Gestational diabetes mellitus in pregnancy, unspecified control: Secondary | ICD-10-CM | POA: Diagnosis not present

## 2017-09-10 DIAGNOSIS — Z3A35 35 weeks gestation of pregnancy: Secondary | ICD-10-CM

## 2017-09-10 DIAGNOSIS — Z331 Pregnant state, incidental: Secondary | ICD-10-CM

## 2017-09-10 DIAGNOSIS — O0990 Supervision of high risk pregnancy, unspecified, unspecified trimester: Secondary | ICD-10-CM

## 2017-09-10 DIAGNOSIS — Z1389 Encounter for screening for other disorder: Secondary | ICD-10-CM

## 2017-09-10 LAB — POCT URINALYSIS DIPSTICK
Glucose, UA: NEGATIVE
LEUKOCYTES UA: NEGATIVE
Nitrite, UA: NEGATIVE
PROTEIN UA: NEGATIVE

## 2017-09-10 MED ORDER — GLYBURIDE 2.5 MG PO TABS: 2.5000 mg | ORAL_TABLET | Freq: Every day | ORAL | 3 refills | Status: DC

## 2017-09-10 NOTE — Progress Notes (Signed)
   HIGH-RISK PREGNANCY VISIT Patient name: Tiffany Dyer MRN [id number]  Date of birth: 1986-01-13 Chief Complaint:   High Risk Gestation  History of Present Illness:   Tiffany Dyer is a 31 y.o. 142P0010 female at 5319w0d with an Estimated Date of Delivery: 10/15/17 being seen today for ongoing management of a high-risk pregnancy complicated by class  A2 DM.  Today she reports backache, no bleeding, no contractions and no leaking. Contractions: Not present.  .  Movement: Present. denies leaking of fluid.  Review of Systems:   Pertinent items are noted in HPI Denies abnormal vaginal discharge w/ itching/odor/irritation, headaches, visual changes, shortness of breath, chest pain, abdominal pain, severe nausea/vomiting, or problems with urination or bowel movements unless otherwise stated above. Pertinent History Reviewed:  Reviewed past medical,surgical, social, obstetrical and family history.  Reviewed problem list, medications and allergies. Physical Assessment:   Vitals:   09/10/17 1501  BP: 130/70  Pulse: 95  Weight: 258 lb (117 kg)  Body mass index is 47.19 kg/m.           Physical Examination:   General appearance: alert, well appearing, and in no distress  Mental status: alert, oriented to person, place, and time  Skin: warm & dry   Extremities: Edema: None    Cardiovascular: normal heart rate noted  Respiratory: normal respiratory effort, no distress  Abdomen: gravid, soft, non-tender  Pelvic: Cervical exam deferred         Fetal Status:     Movement: Present    Fetal Surveillance Testing today: Reactive NST    Assessment & Plan:  1) High-risk pregnancy G2P0010 at 3819w0d with an Estimated Date of Delivery: 10/15/17   2) Class A2 DM, unstable, continue metformin 500 qAM, metformin 1000 with dinner, add glyburide 2.5 mg at bedtime  3) LV EICF, stable  Labs/procedures today: NST  Treatment Plan: Continue twice weekly surveillance with NST but will do a  sonogram with BPP and estimated fetal weight next week, a.m. fastings are still suboptimal so we will add glyburide 2.5 mg at bedtime to her metformin 1000 with dinner and metformin 500 morning.  Her 2-hour postprandials are all good  Reviewed: Preterm labor symptoms and general obstetric precautions including but not limited to vaginal bleeding, contractions, leaking of fluid and fetal movement were reviewed in detail with the patient.  All questions were answered.  Follow-up: Return in about 4 days (around 09/14/2017) for NST, HROB; also schedule sonogram EFW/BPP for 09/17/2017.  Orders Placed This Encounter  Procedures  . US OB Follow Up  . US Fetal BPP W/O Non Stress  . POCT urinalysis dipstick   Patirica Longshore H  09/10/2017 3:53 PM

## 2017-09-11 ENCOUNTER — Encounter: Payer: Self-pay | Admitting: Obstetrics & Gynecology

## 2017-09-12 ENCOUNTER — Encounter: Payer: Self-pay | Admitting: Obstetrics & Gynecology

## 2017-09-14 ENCOUNTER — Ambulatory Visit (INDEPENDENT_AMBULATORY_CARE_PROVIDER_SITE_OTHER): Payer: Managed Care, Other (non HMO) | Admitting: Obstetrics and Gynecology

## 2017-09-14 ENCOUNTER — Encounter: Payer: Self-pay | Admitting: Obstetrics and Gynecology

## 2017-09-14 VITALS — BP 150/100 | HR 102 | Wt 257.0 lb

## 2017-09-14 DIAGNOSIS — O24415 Gestational diabetes mellitus in pregnancy, controlled by oral hypoglycemic drugs: Secondary | ICD-10-CM | POA: Diagnosis not present

## 2017-09-14 DIAGNOSIS — Z331 Pregnant state, incidental: Secondary | ICD-10-CM

## 2017-09-14 DIAGNOSIS — Z3A35 35 weeks gestation of pregnancy: Secondary | ICD-10-CM

## 2017-09-14 DIAGNOSIS — Z1389 Encounter for screening for other disorder: Secondary | ICD-10-CM

## 2017-09-14 DIAGNOSIS — O0990 Supervision of high risk pregnancy, unspecified, unspecified trimester: Secondary | ICD-10-CM

## 2017-09-14 LAB — POCT URINALYSIS DIPSTICK
GLUCOSE UA: NEGATIVE
NITRITE UA: NEGATIVE
Protein, UA: NEGATIVE

## 2017-09-14 NOTE — Progress Notes (Signed)
Tiffany Dyer is a 31 y.o. female High Risk Pregnancy HROB Diagnosis(es):   Class A2 DM - currently taking metformin and glyburide   G2P0010 2329w4d Estimated Date of Delivery: 10/15/17    HPI: The patient is being seen today for ongoing management of above  Today she reports no complaints. She believes her elevated BP today was due to her rushing. Pt brought in her [facility] records. She denies any burning during urination. She plans on having another baby.  Patient reports good fetal movement, denies any bleeding and no rupture of membranes symptoms or regular contractions.   BP weight and urine results reviewed and noted. Blood pressure 140/88, pulse (!) 102, weight 257 lb (116.6 kg), last menstrual period 01/08/2017.  Fundal Height:  36 Fetal Heart rate:  157 Physical Examination: Abdomen - soft, nontender, nondistended, no masses or organomegaly                                     Pelvic - Not indicated                                     Edema:  None  Urinalysis:    POSITIVE for 3+ ketones, 3+ blood, trace leukocytes  Fetal Surveillance Testing today:  NST - reactive  Lab and sonogram results have been reviewed.   Assessment:  1.  Pregnancy at 829w4d,  G2P0010   :Estimated Date of Delivery: 10/15/17                        2.  Class A2 DM                        3. LV EICF   4. BP rechecked                       Reactive NST Medication(s) Plans:  Metformin 500 qAM, metformin 1000 with dinner, glyburide (half a pill) 2.5 mg at bedtime  Treatment Plan:  Continue twice weekly surveillance with NST  Follow up in 3 days (09/17/17) for appointment for high risk OB care, sonogram EFW/BPP    By signing my name below, I, Izna Ahmed, attest that this documentation has been prepared under the direction and in the presence of Tilda BurrowFerguson, Santosh Petter V, MD. Electronically Signed: Redge GainerIzna Ahmed, Medical Scribe. 09/14/17. 11:17 AM.  I personally performed the services described in this  documentation, which was SCRIBED in my presence. The recorded information has been reviewed and considered accurate. It has been edited as necessary during review. Tilda BurrowJohn V Dollene Mallery, MD

## 2017-09-15 ENCOUNTER — Telehealth: Payer: Self-pay | Admitting: *Deleted

## 2017-09-15 LAB — PROTEIN / CREATININE RATIO, URINE
Creatinine, Urine: 170.2 mg/dL
PROTEIN UR: 27 mg/dL
PROTEIN/CREAT RATIO: 159 mg/g{creat} (ref 0–200)

## 2017-09-15 LAB — CBC
HEMATOCRIT: 36.1 % (ref 34.0–46.6)
HEMOGLOBIN: 12.2 g/dL (ref 11.1–15.9)
MCH: 31.6 pg (ref 26.6–33.0)
MCHC: 33.8 g/dL (ref 31.5–35.7)
MCV: 94 fL (ref 79–97)
Platelets: 346 10*3/uL (ref 150–379)
RBC: 3.86 x10E6/uL (ref 3.77–5.28)
RDW: 14.7 % (ref 12.3–15.4)
WBC: 9.6 10*3/uL (ref 3.4–10.8)

## 2017-09-15 LAB — COMPREHENSIVE METABOLIC PANEL
A/G RATIO: 1.3 (ref 1.2–2.2)
ALT: 17 IU/L (ref 0–32)
AST: 17 IU/L (ref 0–40)
Albumin: 3.9 g/dL (ref 3.5–5.5)
Alkaline Phosphatase: 102 IU/L (ref 39–117)
BUN/Creatinine Ratio: 18 (ref 9–23)
BUN: 11 mg/dL (ref 6–20)
Bilirubin Total: 0.2 mg/dL (ref 0.0–1.2)
CALCIUM: 9.6 mg/dL (ref 8.7–10.2)
CO2: 19 mmol/L — AB (ref 20–29)
CREATININE: 0.62 mg/dL (ref 0.57–1.00)
Chloride: 101 mmol/L (ref 96–106)
GFR, EST AFRICAN AMERICAN: 138 mL/min/{1.73_m2} (ref >59)
GFR, EST NON AFRICAN AMERICAN: 120 mL/min/{1.73_m2} (ref >59)
GLOBULIN, TOTAL: 2.9 g/dL (ref 1.5–4.5)
Glucose: 76 mg/dL (ref 65–99)
POTASSIUM: 4.5 mmol/L (ref 3.5–5.2)
SODIUM: 137 mmol/L (ref 134–144)
TOTAL PROTEIN: 6.8 g/dL (ref 6.0–8.5)

## 2017-09-15 NOTE — Telephone Encounter (Signed)
Pt called stating that her BP was up when she left our office yesterday. She states that she checked it this morning and it was 144/111 in one arm and 145/92 in the other.  Pt states that she does not have a headache, blurred vision, not seeing spots, and no c/o epigastric pain. Advised pt to try and lay down on her left side for 10 min and recheck it. Advised to try and take it easy today. Pt states that she is at work. Informed pt that we would call her back if her blood work was abnormal but for now she should take it easy, decrease her sodium intake, etc. Advised pt to call back if she experienced headache, blurred vision, spots, or epigastric pain. Pt verbalized understanding.

## 2017-09-16 LAB — URINE CULTURE

## 2017-09-17 ENCOUNTER — Encounter: Payer: Self-pay | Admitting: Women's Health

## 2017-09-17 ENCOUNTER — Ambulatory Visit (INDEPENDENT_AMBULATORY_CARE_PROVIDER_SITE_OTHER): Payer: Managed Care, Other (non HMO) | Admitting: Women's Health

## 2017-09-17 ENCOUNTER — Telehealth (HOSPITAL_COMMUNITY): Payer: Self-pay | Admitting: *Deleted

## 2017-09-17 ENCOUNTER — Ambulatory Visit (INDEPENDENT_AMBULATORY_CARE_PROVIDER_SITE_OTHER): Payer: Managed Care, Other (non HMO)

## 2017-09-17 ENCOUNTER — Encounter (HOSPITAL_COMMUNITY): Payer: Self-pay | Admitting: *Deleted

## 2017-09-17 VITALS — BP 130/90 | HR 87 | Wt 256.5 lb

## 2017-09-17 DIAGNOSIS — O0993 Supervision of high risk pregnancy, unspecified, third trimester: Secondary | ICD-10-CM

## 2017-09-17 DIAGNOSIS — O98513 Other viral diseases complicating pregnancy, third trimester: Secondary | ICD-10-CM

## 2017-09-17 DIAGNOSIS — B009 Herpesviral infection, unspecified: Secondary | ICD-10-CM

## 2017-09-17 DIAGNOSIS — Z3A36 36 weeks gestation of pregnancy: Secondary | ICD-10-CM

## 2017-09-17 DIAGNOSIS — O24419 Gestational diabetes mellitus in pregnancy, unspecified control: Secondary | ICD-10-CM

## 2017-09-17 DIAGNOSIS — Z1389 Encounter for screening for other disorder: Secondary | ICD-10-CM

## 2017-09-17 DIAGNOSIS — Z331 Pregnant state, incidental: Secondary | ICD-10-CM

## 2017-09-17 DIAGNOSIS — O133 Gestational [pregnancy-induced] hypertension without significant proteinuria, third trimester: Secondary | ICD-10-CM

## 2017-09-17 DIAGNOSIS — O0990 Supervision of high risk pregnancy, unspecified, unspecified trimester: Secondary | ICD-10-CM

## 2017-09-17 LAB — POCT URINALYSIS DIPSTICK
GLUCOSE UA: NEGATIVE
Nitrite, UA: NEGATIVE
Protein, UA: NEGATIVE

## 2017-09-17 NOTE — Progress Notes (Signed)
US 36 wks,cephalic,fhr 142 bpm,BPP 8/8,post pl gr 0,afi 11 cm,EFW 3165 g 73%,AC 90%

## 2017-09-17 NOTE — Addendum Note (Signed)
Addended by: Colen DarlingYOUNG, JANET S on: 09/17/2017 02:13 PM   Modules accepted: Orders

## 2017-09-17 NOTE — Telephone Encounter (Signed)
Preadmission screen  

## 2017-09-17 NOTE — Patient Instructions (Addendum)
Tiffany Dyer, I greatly value your feedback.  If you receive a survey following your visit with us today, we appreciate you taking the time to fill it out.  Thanks, Joellyn HaffKim Alyx Gee, CNM, WHNP-BC   Call the office [phone]) or go to [facility] if:  You begin to have strong, frequent contractions  Your water breaks.  Sometimes it is a big gush of fluid, sometimes it is just a trickle that keeps getting your panties wet or running down your legs  You have vaginal bleeding.  It is normal to have a small amount of spotting if your cervix was checked.   You don't feel your baby moving like normal.  If you don't, get you something to eat and drink and lay down and focus on feeling your baby move.  You should feel at least 10 movements in 2 hours.  If you don't, you should call the office or go to [facility].   If you get consistent blood pressures >160 on top or >110 on bottom call us or [facility] to [facility]  Call the office [phone]) or go to [facility] for these signs of pre-eclampsia:  Severe headache that does not go away with Tylenol  Visual changes- seeing spots, double, blurred vision  Pain under your right breast or upper abdomen that does not go away with Tums or heartburn medicine  Nausea and/or vomiting  Severe swelling in your hands, feet, and face        Preterm Labor and Birth Information The normal length of a pregnancy is 39-41 weeks. Preterm labor is when labor starts before 37 completed weeks of pregnancy. What are the risk factors for preterm labor? Preterm labor is more likely to occur in women who:  Have certain infections during pregnancy such as a bladder infection, sexually transmitted infection, or infection inside the uterus (chorioamnionitis).  Have a shorter-than-normal cervix.  Have gone into preterm labor before.  Have had surgery on their cervix.  Are younger than age 32 or older than age 32.  Are African American.  Are  pregnant with twins or multiple babies (multiple gestation).  Take street drugs or smoke while pregnant.  Do not gain enough weight while pregnant.  Became pregnant shortly after having been pregnant.  What are the symptoms of preterm labor? Symptoms of preterm labor include:  Cramps similar to those that can happen during a menstrual period. The cramps may happen with diarrhea.  Pain in the abdomen or lower back.  Regular uterine contractions that may feel like tightening of the abdomen.  A feeling of increased pressure in the pelvis.  Increased watery or bloody mucus discharge from the vagina.  Water breaking (ruptured amniotic sac).  Why is it important to recognize signs of preterm labor? It is important to recognize signs of preterm labor because babies who are born prematurely may not be fully developed. This can put them at an increased risk for:  Long-term (chronic) heart and lung problems.  Difficulty immediately after birth with regulating body systems, including blood sugar, body temperature, heart rate, and breathing rate.  Bleeding in the brain.  Cerebral palsy.  Learning difficulties.  Death.  These risks are highest for babies who are born before 34 weeks of pregnancy. How is preterm labor treated? Treatment depends on the length of your pregnancy, your condition, and the health of your baby. It may involve:  Having a stitch (suture) placed in your cervix to prevent your cervix from opening too early (  cerclage).  Taking or being given medicines, such as: ? Hormone medicines. These may be given early in pregnancy to help support the pregnancy. ? Medicine to stop contractions. ? Medicines to help mature the baby's lungs. These may be prescribed if the risk of delivery is high. ? Medicines to prevent your baby from developing cerebral palsy.  If the labor happens before 34 weeks of pregnancy, you may need to stay in the hospital. What should I do if I  think I am in preterm labor? If you think that you are going into preterm labor, call your health care provider right away. How can I prevent preterm labor in future pregnancies? To increase your chance of having a full-term pregnancy:  Do not use any tobacco products, such as cigarettes, chewing tobacco, and e-cigarettes. If you need help quitting, ask your health care provider.  Do not use street drugs or medicines that have not been prescribed to you during your pregnancy.  Talk with your health care provider before taking any herbal supplements, even if you have been taking them regularly.  Make sure you gain a healthy amount of weight during your pregnancy.  Watch for infection. If you think that you might have an infection, get it checked right away.  Make sure to tell your health care provider if you have gone into preterm labor before.  This information is not intended to replace advice given to you by your health care provider. Make sure you discuss any questions you have with your health care provider. Document Released: 01/10/2004 Document Revised: 04/01/2016 Document Reviewed: 03/12/2016 Elsevier Interactive Patient Education  2018 [employer]

## 2017-09-17 NOTE — Progress Notes (Addendum)
HIGH-RISK PREGNANCY VISIT Patient name: Tiffany Dyer MRN [id number]  Date of birth: 13-May-1986 Chief Complaint:   High Risk Gestation (US today; BP high at work )  History of Present Illness:   Tiffany Dyer is a 31 y.o. G2P0010 female at 6581w0d with an Estimated Date of Delivery: 10/15/17 being seen today for ongoing management of a high-risk pregnancy [facility], A2DM metformin 500AM/1,000PM and glyburide 2.5mg  PM. 144/111, 128/80, 138/101 Today she reports all sugars normal but two 2hr pp (122, 125) ate baked beans both times. Denies ha, visual changes, ruq/epigastric pain, n/v.   Contractions: Not present. Vag. Bleeding: None.  Movement: Present. denies leaking of fluid.  Review of Systems:   Pertinent items are noted in HPI Denies abnormal vaginal discharge w/ itching/odor/irritation, headaches, visual changes, shortness of breath, chest pain, abdominal pain, severe nausea/vomiting, or problems with urination or bowel movements unless otherwise stated above. Pertinent History Reviewed:  Reviewed past medical,surgical, social, obstetrical and family history.  Reviewed problem list, medications and allergies. Physical Assessment:   Vitals:   09/17/17 1117  BP: 130/90  Pulse: 87  Weight: 256 lb 8 oz (116.3 kg)  Body mass index is 46.91 kg/m.           Physical Examination:   General appearance: alert, well appearing, and in no distress  Mental status: alert, oriented to person, place, and time  Skin: warm & dry   Extremities: Edema: None    Cardiovascular: normal heart rate noted  Respiratory: normal respiratory effort, no distress  Abdomen: gravid, soft, non-tender  Pelvic: Cervical exam performed  Dilation: Closed Effacement (%): Thick Station: -2  Fetal Status: Fetal Heart Rate (bpm): 142 u/s Fundal Height: 38 cm Movement: Present Presentation: Vertex  Fetal Surveillance Testing today: US 36 wks,cephalic,fhr 142 bpm,BPP 8/8,post pl gr 0,afi 11  cm,EFW 3165 g 73%,AC 90%  Results for orders placed or performed in visit on 09/17/17 (from the past 24 hour(s))  POCT urinalysis dipstick   Collection Time: 09/17/17 11:19 AM  Result Value Ref Range   Color, UA     Clarity, UA     Glucose, UA neg    Bilirubin, UA     Ketones, UA 2+    Spec Grav, UA  1.010 - 1.025   Blood, UA 3+    pH, UA  5.0 - 8.0   Protein, UA neg    Urobilinogen, UA  0.2 or 1.0 E.U./dL   Nitrite, UA neg    Leukocytes, UA Moderate (2+) (A) Negative    Assessment & Plan:  1) High-risk pregnancy G2P0010 at 8981w0d with an Estimated Date of Delivery: 10/15/17   2) A2DM, stable, continue metformin 500mg AM/1,000mg PM and glyburide 2.5mg  PM  3) GHTN, dx today, normal pre-e labs 3d ago. Discussed pre-e s/s, reasons to seek care. Continue checking bp's at home, if SBP >=160 and or DBP >=110 call us/[facility]  Labs/procedures today: gbs, gc/ct  Treatment Plan:  2x/wk testing nst alt w/ sono, IOL @ 37wks (d/t GHTN). Scheduled for 11/23 @ 0700. IOL form faxed via Epic and orders placed  Reviewed: Preterm labor symptoms and general obstetric precautions including but not limited to vaginal bleeding, contractions, leaking of fluid and fetal movement were reviewed in detail with the patient.  All questions were answered.  Follow-up: Return for Mon HROB/NST, Wed HROB bpp/dopp.  Orders Placed This Encounter  Procedures  . POCT urinalysis dipstick   Marge DuncansBooker, Kimberly Randall CNM, [facility]NP-BC 09/17/2017 1:19 PM

## 2017-09-17 NOTE — Treatment Plan (Signed)
Induction Assessment Scheduling Form Fax to [facility]:  [phone]  Tiffany Dyer                                                                                   DOB:  22-Jun-1986                                                            MRN:  [id number]                                                                     Phone #:   [phone]                         Provider:  [facility]  GP:  [id number]                                                            Estimated Date of Delivery: 10/15/17  Dating Criteria: LMP c/w 7wk u/s    Medical Indications for induction:  GHTN, A2DM Admission Date/Time:  11/23 @ 0700 Gestational age on admission:  10237.1   Filed Weights   09/17/17 1117  Weight: 256 lb 8 oz (116.3 kg)   HIV:   neg GBS:  pending  SVE: cl/th/-2, vtx   Method of induction(proposed):  cytotec   Scheduling Provider Signature:  Marge DuncansBooker, Martasia Talamante Randall, CNM                                            Today's Date:  09/17/2017

## 2017-09-19 LAB — GC/CHLAMYDIA PROBE AMP
Chlamydia trachomatis, NAA: NEGATIVE
NEISSERIA GONORRHOEAE BY PCR: NEGATIVE

## 2017-09-19 LAB — STREP GP B NAA: Strep Gp B NAA: NEGATIVE

## 2017-09-21 ENCOUNTER — Ambulatory Visit (INDEPENDENT_AMBULATORY_CARE_PROVIDER_SITE_OTHER): Payer: Managed Care, Other (non HMO) | Admitting: Women's Health

## 2017-09-21 ENCOUNTER — Encounter: Payer: Self-pay | Admitting: Women's Health

## 2017-09-21 ENCOUNTER — Other Ambulatory Visit: Payer: Self-pay

## 2017-09-21 VITALS — BP 122/86 | HR 91 | Wt 255.0 lb

## 2017-09-21 DIAGNOSIS — Z1389 Encounter for screening for other disorder: Secondary | ICD-10-CM

## 2017-09-21 DIAGNOSIS — O0990 Supervision of high risk pregnancy, unspecified, unspecified trimester: Secondary | ICD-10-CM

## 2017-09-21 DIAGNOSIS — Z3A36 36 weeks gestation of pregnancy: Secondary | ICD-10-CM | POA: Diagnosis not present

## 2017-09-21 DIAGNOSIS — O24419 Gestational diabetes mellitus in pregnancy, unspecified control: Secondary | ICD-10-CM

## 2017-09-21 DIAGNOSIS — O133 Gestational [pregnancy-induced] hypertension without significant proteinuria, third trimester: Secondary | ICD-10-CM | POA: Diagnosis not present

## 2017-09-21 DIAGNOSIS — O24415 Gestational diabetes mellitus in pregnancy, controlled by oral hypoglycemic drugs: Secondary | ICD-10-CM | POA: Diagnosis not present

## 2017-09-21 DIAGNOSIS — O0993 Supervision of high risk pregnancy, unspecified, third trimester: Secondary | ICD-10-CM

## 2017-09-21 DIAGNOSIS — Z331 Pregnant state, incidental: Secondary | ICD-10-CM

## 2017-09-21 LAB — POCT URINALYSIS DIPSTICK
Glucose, UA: NEGATIVE
NITRITE UA: NEGATIVE
PROTEIN UA: NEGATIVE

## 2017-09-21 NOTE — Patient Instructions (Signed)
Tiffany Dyer, I greatly value your feedback.  If you receive a survey following your visit with us today, we appreciate you taking the time to fill it out.  Thanks, Tiffany Dyer, CNM, WHNP-BC   Call the office [phone]) or go to [facility] if:  You begin to have strong, frequent contractions  Your water breaks.  Sometimes it is a big gush of fluid, sometimes it is just a trickle that keeps getting your panties wet or running down your legs  You have vaginal bleeding.  It is normal to have a small amount of spotting if your cervix was checked.   You don't feel your baby moving like normal.  If you don't, get you something to eat and drink and lay down and focus on feeling your baby move.  You should feel at least 10 movements in 2 hours.  If you don't, you should call the office or go to [facility].     [facility] Contractions of the uterus can occur throughout pregnancy, but they are not always a sign that you are in labor. You may have practice contractions called Braxton Hicks contractions. These false labor contractions are sometimes confused with true labor. What are Tiffany PeltonBraxton Hicks contractions? Braxton Hicks contractions are tightening movements that occur in the muscles of the uterus before labor. Unlike true labor contractions, these contractions do not result in opening (dilation) and thinning of the cervix. Toward the end of pregnancy (32-34 weeks), Braxton Hicks contractions can happen more often and may become stronger. These contractions are sometimes difficult to tell apart from true labor because they can be very uncomfortable. You should not feel embarrassed if you go to the hospital with false labor. Sometimes, the only way to tell if you are in true labor is for your health care provider to look for changes in the cervix. The health care provider will do a physical exam and may monitor your contractions. If you are not in true labor, the exam  should show that your cervix is not dilating and your water has not broken. If there are no prenatal problems or other health problems associated with your pregnancy, it is completely safe for you to be sent home with false labor. You may continue to have Braxton Hicks contractions until you go into true labor. How can I tell the difference between true labor and false labor?  Differences ? False labor ? Contractions last 30-70 seconds.: Contractions are usually shorter and not as strong as true labor contractions. ? Contractions become very regular.: Contractions are usually irregular. ? Discomfort is usually felt in the top of the uterus, and it spreads to the lower abdomen and low back.: Contractions are often felt in the front of the lower abdomen and in the groin. ? Contractions do not go away with walking.: Contractions may go away when you walk around or change positions while lying down. ? Contractions usually become more intense and increase in frequency.: Contractions get weaker and are shorter-lasting as time goes on. ? The cervix dilates and gets thinner.: The cervix usually does not dilate or become thin. Follow these instructions at home:  Take over-the-counter and prescription medicines only as told by your health care provider.  Keep up with your usual exercises and follow other instructions from your health care provider.  Eat and drink lightly if you think you are going into labor.  If Braxton Hicks contractions are making you uncomfortable: ? Change your position from lying down  or resting to walking, or change from walking to resting. ? Sit and rest in a tub of warm water. ? Drink enough fluid to keep your urine clear or pale yellow. Dehydration may cause these contractions. ? Do slow and deep breathing several times an hour.  Keep all follow-up prenatal visits as told by your health care provider. This is important. Contact a health care provider if:  You have a  fever.  You have continuous pain in your abdomen. Get help right away if:  Your contractions become stronger, more regular, and closer together.  You have fluid leaking or gushing from your vagina.  You pass blood-tinged mucus (bloody show).  You have bleeding from your vagina.  You have low back pain that you never had before.  You feel your baby's head pushing down and causing pelvic pressure.  Your baby is not moving inside you as much as it used to. Summary  Contractions that occur before labor are called Braxton Hicks contractions, false labor, or practice contractions.  Braxton Hicks contractions are usually shorter, weaker, farther apart, and less regular than true labor contractions. True labor contractions usually become progressively stronger and regular and they become more frequent.  Manage discomfort from [facility] contractions by changing position, resting in a warm bath, drinking plenty of water, or practicing deep breathing. This information is not intended to replace advice given to you by your health care provider. Make sure you discuss any questions you have with your health care provider. Document Released: 10/20/2005 Document Revised: 09/08/2016 Document Reviewed: 09/08/2016 Elsevier Interactive Patient Education  2017 [employer]

## 2017-09-21 NOTE — Progress Notes (Signed)
   HIGH-RISK PREGNANCY VISIT Patient name: Tiffany Dyer MRN [id number]  Date of birth: [birth year] Chief Complaint:   High Risk Gestation (NST)  History of Present Illness:   Tiffany Dyer is a 31 y.o. 892P0010 female at 3210w4d with an Estimated Date of Delivery: 10/15/17 being seen today for ongoing management of a high-risk pregnancy complicated by GHTN, A2DM on metformin 500AM/1,000PM and glyburide 1.25mg  PM.  Today she reports all fbs wnl but one that was 98 this am, all 2hr pp wnl. Denies ha, visual changes, ruq/epigastric pain, n/v. Is no longer working.  Contractions: Not present. Vag. Bleeding: Scant.  Movement: Present. denies leaking of fluid.  Review of Systems:   Pertinent items are noted in HPI Denies abnormal vaginal discharge w/ itching/odor/irritation, headaches, visual changes, shortness of breath, chest pain, abdominal pain, severe nausea/vomiting, or problems with urination or bowel movements unless otherwise stated above. Pertinent History Reviewed:  Reviewed past medical,surgical, social, obstetrical and family history.  Reviewed problem list, medications and allergies. Physical Assessment:   Vitals:   09/21/17 0957 09/21/17 1040  BP: 122/78 122/86  Pulse: 91   Weight: 255 lb (115.7 kg)   Body mass index is 46.64 kg/m.           Physical Examination:   General appearance: alert, well appearing, and in no distress  Mental status: alert, oriented to person, place, and time  Skin: warm & dry   Extremities: Edema: None    Cardiovascular: normal heart rate noted  Respiratory: normal respiratory effort, no distress  Abdomen: gravid, soft, non-tender  Pelvic: Cervical exam deferred         Fetal Status: Fetal Heart Rate (bpm): 135 Fundal Height: 37 cm Movement: Present    Fetal Surveillance Testing today: NST: FHR baseline 135 bpm, Variability: moderate, Accelerations:present, Decelerations:  Absent= Cat 1/Reactive     Results for orders placed or  performed in visit on 09/21/17 (from the past 24 hour(s))  POCT urinalysis dipstick   Collection Time: 09/21/17 10:00 AM  Result Value Ref Range   Color, UA     Clarity, UA     Glucose, UA neg    Bilirubin, UA     Ketones, UA large    Spec Grav, UA  1.010 - 1.025   Blood, UA large    pH, UA  5.0 - 8.0   Protein, UA neg    Urobilinogen, UA  0.2 or 1.0 E.U./dL   Nitrite, UA neg    Leukocytes, UA Moderate (2+) (A) Negative    Assessment & Plan:  1) High-risk pregnancy G2P0010 at 3210w4d with an Estimated Date of Delivery: 10/15/17   2) A2DM, stable, continue metformin 500AM/1,000PM and glyburide 1.25mg  PM  3) GHTN, improved, bp normal today x2, asymptomatic. Discussed w/ LHE, OK to go past 37wks if bp's remain normal  Labs/procedures today: NST  Treatment Plan:  We are closed this Thurs/Fri for Thanksgiving, so will have her come back Wed for HROB/NST, recheck BP, decided if need to cancel 11/23 IOL  Reviewed: Preterm labor symptoms and general obstetric precautions including but not limited to vaginal bleeding, contractions, leaking of fluid and fetal movement were reviewed in detail with the patient.  All questions were answered.  Follow-up: Return for Wed for HROB/NST.  Orders Placed This Encounter  Procedures  . POCT urinalysis dipstick   Marge DuncansBooker, Alecea Trego Randall CNM, [facility] 09/21/2017 10:45 AM

## 2017-09-23 ENCOUNTER — Ambulatory Visit (INDEPENDENT_AMBULATORY_CARE_PROVIDER_SITE_OTHER): Payer: Managed Care, Other (non HMO) | Admitting: Obstetrics and Gynecology

## 2017-09-23 ENCOUNTER — Other Ambulatory Visit: Payer: Managed Care, Other (non HMO)

## 2017-09-23 VITALS — BP 132/80 | HR 78 | Wt 252.0 lb

## 2017-09-23 DIAGNOSIS — Z3A36 36 weeks gestation of pregnancy: Secondary | ICD-10-CM

## 2017-09-23 DIAGNOSIS — O24415 Gestational diabetes mellitus in pregnancy, controlled by oral hypoglycemic drugs: Secondary | ICD-10-CM | POA: Diagnosis not present

## 2017-09-23 DIAGNOSIS — Z1389 Encounter for screening for other disorder: Secondary | ICD-10-CM

## 2017-09-23 DIAGNOSIS — Z331 Pregnant state, incidental: Secondary | ICD-10-CM

## 2017-09-23 DIAGNOSIS — O0990 Supervision of high risk pregnancy, unspecified, unspecified trimester: Secondary | ICD-10-CM

## 2017-09-23 DIAGNOSIS — O133 Gestational [pregnancy-induced] hypertension without significant proteinuria, third trimester: Secondary | ICD-10-CM | POA: Diagnosis not present

## 2017-09-23 LAB — POCT URINALYSIS DIPSTICK
Blood, UA: NEGATIVE
Glucose, UA: NEGATIVE
Ketones, UA: NEGATIVE
Leukocytes, UA: NEGATIVE
NITRITE UA: NEGATIVE
PROTEIN UA: NEGATIVE

## 2017-09-23 NOTE — Progress Notes (Addendum)
Tiffany Dyer is a 31 y.o. female High Risk Pregnancy HROB Diagnosis(es):   GHTN, A2DM on metformin 500AM/1,000PM and glyburide 1.25mg  PM.   G2P0010 3034w6d Estimated Date of Delivery: 10/15/17    HPI: The patient is being seen today for ongoing management of GHTN, A2DM on metformin 500AM/1,000PM and glyburide 1.25mg  PM.  Today she reports no complaints. She states she was scheduled for an IOL on 09/25/17, however Dr. Despina HiddenEure stated they would re-check her BP on 09/23/17 to decide if IOL needs to be cancelled.Reviewing her blood pressures I agree with that plan.She has done much better With blood pressure control since going out of work  She states her blood sugar levels have been stable. On the 19th, she had levels of 98Fasting 1 and 129.Postprandial 1, All others are normal She denies HA, blurry vision. Patient reports good fetal movement, denies any bleeding and no rupture of membranes symptoms or regular contractions.   BP weight and urine results reviewed and noted. Blood pressure 132/80, pulse 78, weight 252 lb (114.3 kg), last menstrual period 01/08/2017.  Fundal Height:  36 Fetal Heart rate: 145, with accels to 170 Physical Examination: Abdomen - soft, nontender, nondistended, no masses or organomegaly                                     Pelvic - Not indicated                                     Edema: None  Urinalysis:NEGATIVE   Fetal Surveillance Testing today:  NST - reactive  Lab and sonogram results have been reviewed.  Assessment:  1. Pregnancy at 6434w6d, G2P0010:  Estimated Date of Delivery: 10/15/17                        2. GHTN, stableBlood pressures Have return to normal,  normal today, IOL rescheduled for 39 weeks                        3. A2DM, on metformin 500AM/1,000PM, glyburide 1.25mg  PM, With excellent control     Medication(s) Plans:   metformin 500AM/1,000PM, glyburide 1.25mg  PM  Treatment Plan: 2x/wk testing nst alt w/ BPP sono, IOL at 39 weeks  (10/09/17)  Follow up in 5 days for appointment for high risk OB care, NST on monday, U/S Or NSTon Thursday.    By signing my name below, I, Izna Ahmed, attest that this documentation has been prepared under the direction and in the presence of Tilda BurrowFerguson, Lanisa Ishler V, MD. Electronically Signed: Redge GainerIzna Ahmed, Medical Scribe. 09/23/17. 11:54 AM.  I personally performed the services described in this documentation, which was SCRIBED in my presence. The recorded information has been reviewed and considered accurate. It has been edited as necessary during review. Tilda BurrowJohn V Contina Strain, MD

## 2017-09-25 ENCOUNTER — Inpatient Hospital Stay (HOSPITAL_COMMUNITY)
Admission: RE | Admit: 2017-09-25 | Discharge: 2017-09-25 | Disposition: A | Discharge status: Home / Self Care | Payer: Managed Care, Other (non HMO) | Source: Ambulatory Visit | Attending: Obstetrics and Gynecology | Admitting: Obstetrics and Gynecology

## 2017-09-28 ENCOUNTER — Ambulatory Visit (INDEPENDENT_AMBULATORY_CARE_PROVIDER_SITE_OTHER): Payer: Managed Care, Other (non HMO) | Admitting: Obstetrics & Gynecology

## 2017-09-28 ENCOUNTER — Encounter: Payer: Self-pay | Admitting: Obstetrics & Gynecology

## 2017-09-28 VITALS — BP 122/80 | HR 91 | Wt 256.0 lb

## 2017-09-28 DIAGNOSIS — Z331 Pregnant state, incidental: Secondary | ICD-10-CM

## 2017-09-28 DIAGNOSIS — O0993 Supervision of high risk pregnancy, unspecified, third trimester: Secondary | ICD-10-CM

## 2017-09-28 DIAGNOSIS — Z3A37 37 weeks gestation of pregnancy: Secondary | ICD-10-CM | POA: Diagnosis not present

## 2017-09-28 DIAGNOSIS — Z1389 Encounter for screening for other disorder: Secondary | ICD-10-CM

## 2017-09-28 DIAGNOSIS — O24419 Gestational diabetes mellitus in pregnancy, unspecified control: Secondary | ICD-10-CM | POA: Diagnosis not present

## 2017-09-28 LAB — POCT URINALYSIS DIPSTICK
Glucose, UA: NEGATIVE
Nitrite, UA: NEGATIVE
Protein, UA: NEGATIVE

## 2017-09-28 NOTE — Progress Notes (Signed)
   HIGH-RISK PREGNANCY VISIT Patient name: Tiffany Dyer MRN [id number]  Date of birth: 01/01/1986 Chief Complaint:   High Risk Gestation (NST)  History of Present Illness:   Tiffany Dyer is a 31 y.o. 42P0010 female at 6787w4d with an Estimated Date of Delivery: 10/15/17 being seen today for ongoing management of a high-risk pregnancy complicated by Class A2 DM, borderline BP.  Today she reports no complaints. Contractions: Not present. Vag. Bleeding: None.  Movement: Present. denies leaking of fluid.  Review of Systems:   Pertinent items are noted in HPI Denies abnormal vaginal discharge w/ itching/odor/irritation, headaches, visual changes, shortness of breath, chest pain, abdominal pain, severe nausea/vomiting, or problems with urination or bowel movements unless otherwise stated above. Pertinent History Reviewed:  Reviewed past medical,surgical, social, obstetrical and family history.  Reviewed problem list, medications and allergies. Physical Assessment:   Vitals:   09/28/17 1046  BP: 122/80  Pulse: 91  Weight: 256 lb (116.1 kg)  Body mass index is 46.82 kg/m.           Physical Examination:   General appearance: alert, well appearing, and in no distress  Mental status: alert, oriented to person, place, and time  Skin: warm & dry   Extremities: Edema: None    Cardiovascular: normal heart rate noted  Respiratory: normal respiratory effort, no distress  Abdomen: gravid, soft, non-tender  Pelvic: Cervical exam deferred         Fetal Status: Fetal Heart Rate (bpm): 140 Fundal Height: 38 cm Movement: Present    Fetal Surveillance Testing today: Reactive NST   Results for orders placed or performed in visit on 09/28/17 (from the past 24 hour(s))  POCT urinalysis dipstick   Collection Time: 09/28/17 10:48 AM  Result Value Ref Range   Color, UA     Clarity, UA     Glucose, UA neg    Bilirubin, UA     Ketones, UA large    Spec Grav, UA  1.010 - 1.025   Blood, UA  small    pH, UA  5.0 - 8.0   Protein, UA neg    Urobilinogen, UA  0.2 or 1.0 E.U./dL   Nitrite, UA neg    Leukocytes, UA Large (3+) (A) Negative    Assessment & Plan:  1) High-risk pregnancy G2P0010 at 1687w4d with an Estimated Date of Delivery: 10/15/17   2) Class A2 DM, stable, metformin 500 am, 1000 supper, glucophage 1.25 mg bedtime  3) Borderline BP, stable, already scheduled for 39 weeks, BP always a bit bordrline  Labs/procedures today: reactive NST  Treatment Plan:  twcie weekly assessments, induction scheduled for 10/09/2017  Reviewed:   Follow-up: Return in about 3 days (around 10/01/2017) for NST, HROB.  Orders Placed This Encounter  Procedures  . POCT urinalysis dipstick   Lazaro ArmsLuther H Retal Tonkinson 09/28/2017 11:25 AM

## 2017-10-01 ENCOUNTER — Ambulatory Visit (INDEPENDENT_AMBULATORY_CARE_PROVIDER_SITE_OTHER): Payer: Managed Care, Other (non HMO) | Admitting: Obstetrics and Gynecology

## 2017-10-01 VITALS — BP 122/76 | HR 90 | Wt 256.0 lb

## 2017-10-01 DIAGNOSIS — Z3A38 38 weeks gestation of pregnancy: Secondary | ICD-10-CM

## 2017-10-01 DIAGNOSIS — O24419 Gestational diabetes mellitus in pregnancy, unspecified control: Secondary | ICD-10-CM | POA: Diagnosis not present

## 2017-10-01 DIAGNOSIS — O0993 Supervision of high risk pregnancy, unspecified, third trimester: Secondary | ICD-10-CM

## 2017-10-01 DIAGNOSIS — Z331 Pregnant state, incidental: Secondary | ICD-10-CM

## 2017-10-01 DIAGNOSIS — Z1389 Encounter for screening for other disorder: Secondary | ICD-10-CM

## 2017-10-01 LAB — POCT URINALYSIS DIPSTICK
Glucose, UA: NEGATIVE
KETONES UA: NEGATIVE
Nitrite, UA: NEGATIVE
PROTEIN UA: NEGATIVE

## 2017-10-01 NOTE — Progress Notes (Signed)
Tiffany Dyer is a 31 y.o. female High Risk Pregnancy HROB Diagnosis(es):   GHTN, A2DM on metformin 500AM/1,000PM and glyburide 1.25mg  PM.  G2P0010 7193w0d Estimated Date of Delivery: 10/15/17    HPI: The patient is being seen today for ongoing management of Class A2 DM, borderline BP. Today she reports no complaints. She says last night she was feeling sick, with a blood sugar level of 61. She ate something to bring up her BGL. Patient reports good fetal movement, denies any bleeding and no rupture of membranes symptoms or regular contractions.   BP weight and urine results reviewed and noted. Blood pressure 122/76, pulse 90, weight 256 lb (116.1 kg), last menstrual period 01/08/2017.  Fundal Height:  37.5 Fetal Heart rate:  142 with accels to 165 Physical Examination: Abdomen - soft, nontender, nondistended, no masses or organomegaly                                     Pelvic - Not indicated                                     Edema:  None  Urinalysis:small blood, 2+ leukocytes  Fetal Surveillance Testing today:  NST - Reactive  Lab and sonogram results have been reviewed.    Assessment:  1.  Pregnancy at 1993w0d,  G2P0010   :  Estimated Date of Delivery: 10/15/17                        2.  Class A2 DM,                         3. borderline BP.   Medication(s) Plans: metformin 500 am, 1000 supper, glyburide 1.25mg  bedtime  Treatment Plan: twice weekly assessments, induction scheduled for 39 weeks, 10/09/2017. Pt advised to stop taking glyburide if her blood sugar levels drop too low again and patient becomes symptomatic  Follow up in 5 days for appointment for high risk OB care, NST   By signing my name below, I, Izna Ahmed, attest that this documentation has been prepared under the direction and in the presence of Tilda BurrowFerguson, Abri Vacca V, MD. Electronically Signed: Redge GainerIzna Ahmed, Medical Scribe. 10/01/17. 3:25 PM.  I personally performed the services described in this  documentation, which was SCRIBED in my presence. The recorded information has been reviewed and considered accurate. It has been edited as necessary during review. Tilda BurrowJohn V Gotti Alwin, MD

## 2017-10-05 ENCOUNTER — Other Ambulatory Visit: Payer: Self-pay | Admitting: Obstetrics & Gynecology

## 2017-10-06 ENCOUNTER — Other Ambulatory Visit: Payer: Self-pay | Admitting: Certified Nurse Midwife

## 2017-10-06 ENCOUNTER — Telehealth: Payer: Self-pay | Admitting: *Deleted

## 2017-10-06 NOTE — Telephone Encounter (Signed)
Pt reports that she was vomiting all day on Friday, this is better now and has not vomited anymore. She has had  Diarrhea a few times every day since Saturday. She also reports itching or her hands and feet that started yesterday. She denies any rash. Mostly itching after washing her hands. Informed patient that I would disuss her concerns with a provider and let her know if she needs to be seen today instead of tomorrow.  Discussed patient with Drenda FreezeFran, she stated that patient should come in fasting tomorrow to have Bile Acids checked. Informed patient of this and she stated that she will arrive early.

## 2017-10-07 ENCOUNTER — Inpatient Hospital Stay (EMERGENCY_DEPARTMENT_HOSPITAL)
Admission: AD | Admit: 2017-10-07 | Discharge: 2017-10-07 | Disposition: A | Discharge status: Home / Self Care | Payer: Managed Care, Other (non HMO) | Source: Ambulatory Visit | Attending: Obstetrics and Gynecology | Admitting: Obstetrics and Gynecology

## 2017-10-07 ENCOUNTER — Encounter: Payer: Self-pay | Admitting: Advanced Practice Midwife

## 2017-10-07 ENCOUNTER — Ambulatory Visit (INDEPENDENT_AMBULATORY_CARE_PROVIDER_SITE_OTHER): Payer: Managed Care, Other (non HMO) | Admitting: Advanced Practice Midwife

## 2017-10-07 VITALS — BP 130/86 | HR 85 | Wt 253.0 lb

## 2017-10-07 DIAGNOSIS — Z3A38 38 weeks gestation of pregnancy: Secondary | ICD-10-CM | POA: Diagnosis not present

## 2017-10-07 DIAGNOSIS — O24419 Gestational diabetes mellitus in pregnancy, unspecified control: Secondary | ICD-10-CM | POA: Diagnosis not present

## 2017-10-07 DIAGNOSIS — O0990 Supervision of high risk pregnancy, unspecified, unspecified trimester: Secondary | ICD-10-CM

## 2017-10-07 DIAGNOSIS — O133 Gestational [pregnancy-induced] hypertension without significant proteinuria, third trimester: Secondary | ICD-10-CM

## 2017-10-07 DIAGNOSIS — O479 False labor, unspecified: Secondary | ICD-10-CM

## 2017-10-07 DIAGNOSIS — Z1389 Encounter for screening for other disorder: Secondary | ICD-10-CM

## 2017-10-07 DIAGNOSIS — Z331 Pregnant state, incidental: Secondary | ICD-10-CM

## 2017-10-07 DIAGNOSIS — O471 False labor at or after 37 completed weeks of gestation: Secondary | ICD-10-CM

## 2017-10-07 LAB — POCT URINALYSIS DIPSTICK
Glucose, UA: NEGATIVE
LEUKOCYTES UA: NEGATIVE
NITRITE UA: NEGATIVE
Protein, UA: NEGATIVE

## 2017-10-07 NOTE — Progress Notes (Signed)
HIGH-RISK PREGNANCY VISIT Patient name: Tiffany Dyer MRN [id number]  Date of birth: Aug 02, 1986 Chief Complaint:   High Risk Gestation (NST)  History of Present Illness:   Tiffany Dyer is a 31 y.o. 142P0010 female at 6962w6d with an Estimated Date of Delivery: 10/15/17 being seen today for ongoing management of a high-risk pregnancy complicated by gestational DM, class  A2 DM. And borderline HTN. Today she reports ctx q 7-8 minutes. Went to [facility], cx closed this am. . Contractions: Irregular. Vag. Bleeding: Scant.  Movement: Present. denies leaking of fluid.  Review of Systems:   Pertinent items are noted in HPI Denies abnormal vaginal discharge w/ itching/odor/irritation, headaches, visual changes, shortness of breath, chest pain, abdominal pain, severe nausea/vomiting, or problems with urination or bowel movements unless otherwise stated above.    Pertinent History Reviewed:  Medical & Surgical Hx:   Past Medical History:  Diagnosis Date  . Genital herpes 2008  . Gestational diabetes   . Pregnancy induced hypertension    Past Surgical History:  Procedure Laterality Date  . INDUCED ABORTION    . WISDOM TOOTH EXTRACTION     Family History  Problem Relation Age of Onset  . Other Paternal Grandfather        poor circulation  . Thyroid disease Paternal Grandfather   . Diabetes Paternal Grandmother   . Hypertension Maternal Grandmother   . Cancer Maternal Grandfather        kidney  . Diabetes Father     Current Outpatient Medications:  .  acyclovir (ZOVIRAX) 400 MG tablet, Take 1 tablet (400 mg total) 3 (three) times daily by mouth., Disp: 90 tablet, Rfl: 2 .  Cetirizine HCl (ZYRTEC ALLERGY PO), Take by mouth as needed., Disp: , Rfl:  .  clotrimazole-betamethasone (LOTRISONE) cream, Apply 1 application topically 2 (two) times daily. (Patient taking differently: Apply 1 application as needed topically. ), Disp: 30 g, Rfl: 1 .  glyBURIDE (DIABETA) 2.5 MG tablet, Take 1 tablet  (2.5 mg total) at bedtime by mouth. (Patient taking differently: Take at bedtime by mouth. Takes half a pill), Disp: 30 tablet, Rfl: 3 .  metFORMIN (GLUCOPHAGE) 500 MG tablet, 1 tablet with breakfast and 2 tablets with dinner, Disp: 90 tablet, Rfl: 1 .  omeprazole (PRILOSEC) 20 MG capsule, Take 1 capsule (20 mg total) by mouth daily. 1 tablet a day, Disp: 30 capsule, Rfl: 6 .  ondansetron (ZOFRAN) 4 MG tablet, Take 1 tablet (4 mg total) by mouth every 8 (eight) hours as needed for nausea or vomiting., Disp: 40 tablet, Rfl: 2 .  Prenatal Vit-Fe Fumarate-FA (PRENATAL VITAMIN PO), Take by mouth. Takes 2 daily, Disp: , Rfl:  .  promethazine (PHENERGAN) 12.5 MG tablet, TAKE 1 TABLET BY MOUTH EVERY 6 HOURS AS NEEDED FOR NAUSEA AND VOMITING, Disp: 30 tablet, Rfl: 1 Social History: Reviewed -  reports that  has never smoked. she has never used smokeless tobacco.   Physical Assessment:   Vitals:   10/07/17 0912  BP: 130/86  Pulse: 85  Weight: 253 lb (114.8 kg)  Body mass index is 46.27 kg/m.           Physical Examination:   General appearance: alert, well appearing, and in no distress  Mental status: alert, oriented to person, place, and time  Skin: warm & dry   Extremities: Edema: None    Cardiovascular: normal heart rate noted  Respiratory: normal respiratory effort, no distress  Abdomen: gravid, soft, non-tender  Pelvic: Cervical exam deferred  Fetal Status: Fetal Heart Rate (bpm): 135   Movement: Present    Fetal Surveillance Testing today: NST.  NST: FHR baseline 135 bpm, Variability: moderate, Accelerations:present, Decelerations:  Absent= Cat 1/Reactive   Results for orders placed or performed in visit on 10/07/17 (from the past 24 hour(s))  POCT urinalysis dipstick   Collection Time: 10/07/17  9:18 AM  Result Value Ref Range   Color, UA     Clarity, UA     Glucose, UA neg    Bilirubin, UA     Ketones, UA mod    Spec Grav, UA  1.010 - 1.025   Blood, UA mod    pH, UA   5.0 - 8.0   Protein, UA neg    Urobilinogen, UA  0.2 or 1.0 E.U./dL   Nitrite, UA neg    Leukocytes, UA Negative Negative    Assessment & Plan:  1) High-risk pregnancy G2P0010 at 7857w6d with an Estimated Date of Delivery: 10/15/17   2) Borderline BP, stable  3) A2DM, stable  Labs/procedures today: NST  Medications: metformin 500/1000  Treatment Plan:  IOL scheduled for 12/7 by [facility]. COuld be in latent labor, may come to [facility] for cx check before going back to [facility] if unsure.    Follow-up: Return in about 12 days (around 10/19/2017) for pp BP check.  Orders Placed This Encounter  Procedures  . POCT urinalysis dipstick   CRESENZO-DISHMAN,Kamran Coker CNM 10/07/2017 10:49 AM

## 2017-10-07 NOTE — MAU Note (Signed)
Pt states cntx started 0100 tonight and are now 5-6 mins, rating pain 5/10. Denies LOF. Some blood noted when wiping. +FM

## 2017-10-07 NOTE — Progress Notes (Signed)
Error

## 2017-10-07 NOTE — MAU Provider Note (Signed)
Shonna Jones-Garrett is a 31 y.o. 532P0010 female at 4781w6d  RN Labor check, not seen by provider SVE: Dilation: Closed Effacement (%): Thick Station: -2 Exam by:: Bari Mantis Lytle RN NST: FHR baseline 135 bpm, Variability: moderate, Accelerations:present, Decelerations:  Absent= Cat 1/Reactive UCs: q 4-236mins D/C home Marge DuncansBooker, Kimberly Randall CNM, [facility] 10/07/2017 7:15 AM

## 2017-10-07 NOTE — MAU Note (Signed)
I have communicated with Genella RifeK. Booker, CNM and reviewed vital signs:  Vitals:   10/07/17 0640  BP: 131/66  Pulse: 87  Resp: 16  Temp: 98 F (36.7 C)    Vaginal exam:  Dilation: Closed Effacement (%): Thick Station: -2 Exam by:: T Air cabin crewLytle RN,   Also reviewed contraction pattern and that non-stress test is reactive.  It has been documented that patient is contracting irregularly  minutes and has a closed  Cervix not indicating active labor.  Patient denies any other complaints.  Based on this report provider has given order for discharge.  A discharge order and diagnosis entered by a provider.   Labor discharge instructions reviewed with patient.

## 2017-10-09 ENCOUNTER — Encounter (HOSPITAL_COMMUNITY): Payer: Self-pay

## 2017-10-09 ENCOUNTER — Other Ambulatory Visit: Payer: Self-pay

## 2017-10-09 ENCOUNTER — Inpatient Hospital Stay (HOSPITAL_COMMUNITY): Payer: Managed Care, Other (non HMO) | Admitting: Anesthesiology

## 2017-10-09 ENCOUNTER — Inpatient Hospital Stay (HOSPITAL_COMMUNITY)
Admission: RE | Admit: 2017-10-09 | Discharge: 2017-10-12 | DRG: 805 | Disposition: A | Discharge status: Home / Self Care | Payer: Managed Care, Other (non HMO) | Source: Ambulatory Visit | Attending: Obstetrics & Gynecology | Admitting: Obstetrics & Gynecology

## 2017-10-09 VITALS — BP 123/70 | HR 83 | Temp 98.4°F | Resp 20 | Ht 62.0 in | Wt 248.2 lb

## 2017-10-09 DIAGNOSIS — A6000 Herpesviral infection of urogenital system, unspecified: Secondary | ICD-10-CM | POA: Diagnosis present

## 2017-10-09 DIAGNOSIS — Z3A39 39 weeks gestation of pregnancy: Secondary | ICD-10-CM

## 2017-10-09 DIAGNOSIS — O0990 Supervision of high risk pregnancy, unspecified, unspecified trimester: Secondary | ICD-10-CM

## 2017-10-09 DIAGNOSIS — O24425 Gestational diabetes mellitus in childbirth, controlled by oral hypoglycemic drugs: Secondary | ICD-10-CM | POA: Diagnosis present

## 2017-10-09 DIAGNOSIS — O133 Gestational [pregnancy-induced] hypertension without significant proteinuria, third trimester: Secondary | ICD-10-CM

## 2017-10-09 DIAGNOSIS — Z3490 Encounter for supervision of normal pregnancy, unspecified, unspecified trimester: Secondary | ICD-10-CM | POA: Diagnosis present

## 2017-10-09 DIAGNOSIS — O2412 Pre-existing diabetes mellitus, type 2, in childbirth: Secondary | ICD-10-CM | POA: Diagnosis not present

## 2017-10-09 DIAGNOSIS — O411230 Chorioamnionitis, third trimester, not applicable or unspecified: Secondary | ICD-10-CM | POA: Diagnosis present

## 2017-10-09 DIAGNOSIS — O9832 Other infections with a predominantly sexual mode of transmission complicating childbirth: Secondary | ICD-10-CM | POA: Diagnosis present

## 2017-10-09 DIAGNOSIS — O134 Gestational [pregnancy-induced] hypertension without significant proteinuria, complicating childbirth: Secondary | ICD-10-CM | POA: Diagnosis present

## 2017-10-09 DIAGNOSIS — E119 Type 2 diabetes mellitus without complications: Secondary | ICD-10-CM | POA: Diagnosis not present

## 2017-10-09 LAB — CBC
HCT: 35 % — ABNORMAL LOW (ref 36.0–46.0)
Hemoglobin: 12.1 g/dL (ref 12.0–15.0)
MCH: 32.7 pg (ref 26.0–34.0)
MCHC: 34.6 g/dL (ref 30.0–36.0)
MCV: 94.6 fL (ref 78.0–100.0)
Platelets: 315 10*3/uL (ref 150–400)
RBC: 3.7 MIL/uL — ABNORMAL LOW (ref 3.87–5.11)
RDW: 15 % (ref 11.5–15.5)
WBC: 9.8 10*3/uL (ref 4.0–10.5)

## 2017-10-09 LAB — GLUCOSE, CAPILLARY
GLUCOSE-CAPILLARY: 53 mg/dL — AB (ref 65–99)
GLUCOSE-CAPILLARY: 51 mg/dL — AB (ref 65–99)
GLUCOSE-CAPILLARY: 60 mg/dL — AB (ref 65–99)
GLUCOSE-CAPILLARY: 125 mg/dL — AB (ref 65–99)
Glucose-Capillary: 68 mg/dL (ref 65–99)
Glucose-Capillary: 85 mg/dL (ref 65–99)
Glucose-Capillary: 53 mg/dL — ABNORMAL LOW (ref 65–99)
Glucose-Capillary: 87 mg/dL (ref 65–99)

## 2017-10-09 LAB — TYPE AND SCREEN
ABO/RH(D): A POS
Antibody Screen: NEGATIVE

## 2017-10-09 LAB — GLUCOSE, RANDOM: GLUCOSE: 92 mg/dL (ref 65–99)

## 2017-10-09 LAB — RPR: RPR Ser Ql: NONREACTIVE

## 2017-10-09 LAB — ABO/RH: ABO/RH(D): A POS

## 2017-10-09 MED ORDER — LIDOCAINE HCL (PF) 1 % IJ SOLN
INTRAMUSCULAR | Status: DC | PRN
  Administered 2017-10-09: 4 mL
  Administered 2017-10-09: 6 mL via EPIDURAL

## 2017-10-09 MED ORDER — SODIUM CHLORIDE 0.9 % IV SOLN
2.0000 g | Freq: Four times a day (QID) | INTRAVENOUS | Status: DC
  Administered 2017-10-09: 2 g via INTRAVENOUS
  Filled 2017-10-09 (×3): qty 2000

## 2017-10-09 MED ORDER — ONDANSETRON HCL 4 MG/2ML IJ SOLN: 4.0000 mg | Freq: Four times a day (QID) | INTRAMUSCULAR | Status: DC | PRN

## 2017-10-09 MED ORDER — EPHEDRINE 5 MG/ML INJ
10.0000 mg | INTRAVENOUS | Status: DC | PRN
  Filled 2017-10-09: qty 2

## 2017-10-09 MED ORDER — DIPHENHYDRAMINE HCL 50 MG/ML IJ SOLN: 12.5000 mg | INTRAMUSCULAR | Status: DC | PRN

## 2017-10-09 MED ORDER — ACETAMINOPHEN 325 MG PO TABS
650.0000 mg | ORAL_TABLET | ORAL | Status: DC | PRN
  Administered 2017-10-09: 650 mg via ORAL
  Filled 2017-10-09: qty 2

## 2017-10-09 MED ORDER — OXYTOCIN 40 UNITS IN LACTATED RINGERS INFUSION - SIMPLE MED
2.5000 [IU]/h | INTRAVENOUS | Status: DC
  Administered 2017-10-10: 2.5 [IU]/h via INTRAVENOUS
  Filled 2017-10-09: qty 1000

## 2017-10-09 MED ORDER — OXYTOCIN 40 UNITS IN LACTATED RINGERS INFUSION - SIMPLE MED
1.0000 m[IU]/min | INTRAVENOUS | Status: DC
  Administered 2017-10-09: 2 m[IU]/min via INTRAVENOUS

## 2017-10-09 MED ORDER — LACTATED RINGERS IV SOLN
INTRAVENOUS | Status: DC
  Administered 2017-10-09: 23:00:00 via INTRAUTERINE

## 2017-10-09 MED ORDER — LIDOCAINE HCL (PF) 1 % IJ SOLN
30.0000 mL | INTRAMUSCULAR | Status: AC | PRN
  Administered 2017-10-10: 30 mL via SUBCUTANEOUS
  Filled 2017-10-09: qty 30

## 2017-10-09 MED ORDER — OXYTOCIN BOLUS FROM INFUSION
500.0000 mL | Freq: Once | INTRAVENOUS | Status: AC
  Administered 2017-10-10: 500 mL via INTRAVENOUS

## 2017-10-09 MED ORDER — FENTANYL CITRATE (PF) 100 MCG/2ML IJ SOLN
100.0000 ug | INTRAMUSCULAR | Status: DC | PRN
  Administered 2017-10-09 (×2): 100 ug via INTRAVENOUS
  Filled 2017-10-09 (×2): qty 2

## 2017-10-09 MED ORDER — FENTANYL 2.5 MCG/ML BUPIVACAINE 1/10 % EPIDURAL INFUSION (WH - ANES)
14.0000 mL/h | INTRAMUSCULAR | Status: DC | PRN
  Administered 2017-10-09 (×2): 14 mL/h via EPIDURAL
  Filled 2017-10-09 (×2): qty 100

## 2017-10-09 MED ORDER — LACTATED RINGERS IV SOLN
500.0000 mL | INTRAVENOUS | Status: DC | PRN
  Administered 2017-10-09: 500 mL via INTRAVENOUS

## 2017-10-09 MED ORDER — OXYCODONE-ACETAMINOPHEN 5-325 MG PO TABS: 2.0000 | ORAL_TABLET | ORAL | Status: DC | PRN

## 2017-10-09 MED ORDER — TERBUTALINE SULFATE 1 MG/ML IJ SOLN
0.2500 mg | Freq: Once | INTRAMUSCULAR | Status: DC | PRN
  Filled 2017-10-09: qty 1

## 2017-10-09 MED ORDER — FENTANYL CITRATE (PF) 100 MCG/2ML IJ SOLN: 50.0000 ug | INTRAMUSCULAR | Status: DC | PRN

## 2017-10-09 MED ORDER — PHENYLEPHRINE 40 MCG/ML (10ML) SYRINGE FOR IV PUSH (FOR BLOOD PRESSURE SUPPORT)
80.0000 ug | PREFILLED_SYRINGE | INTRAVENOUS | Status: DC | PRN
  Filled 2017-10-09: qty 5

## 2017-10-09 MED ORDER — PHENYLEPHRINE 40 MCG/ML (10ML) SYRINGE FOR IV PUSH (FOR BLOOD PRESSURE SUPPORT)
80.0000 ug | PREFILLED_SYRINGE | INTRAVENOUS | Status: DC | PRN
  Filled 2017-10-09: qty 5
  Filled 2017-10-09: qty 10

## 2017-10-09 MED ORDER — MISOPROSTOL 25 MCG QUARTER TABLET
25.0000 ug | ORAL_TABLET | ORAL | Status: DC | PRN
  Filled 2017-10-09: qty 1

## 2017-10-09 MED ORDER — LACTATED RINGERS IV SOLN
INTRAVENOUS | Status: DC
  Administered 2017-10-09 (×3): via INTRAVENOUS

## 2017-10-09 MED ORDER — SOD CITRATE-CITRIC ACID 500-334 MG/5ML PO SOLN: 30.0000 mL | ORAL | Status: DC | PRN

## 2017-10-09 MED ORDER — DEXTROSE IN LACTATED RINGERS 5 % IV SOLN
INTRAVENOUS | Status: DC
  Administered 2017-10-09: 18:00:00 via INTRAVENOUS

## 2017-10-09 MED ORDER — LACTATED RINGERS IV SOLN
500.0000 mL | Freq: Once | INTRAVENOUS | Status: AC
  Administered 2017-10-09: 500 mL via INTRAVENOUS

## 2017-10-09 MED ORDER — GENTAMICIN SULFATE 40 MG/ML IJ SOLN
5.0000 mg/kg | INTRAVENOUS | Status: DC
  Administered 2017-10-09: 380 mg via INTRAVENOUS
  Filled 2017-10-09: qty 9.5

## 2017-10-09 MED ORDER — OXYCODONE-ACETAMINOPHEN 5-325 MG PO TABS: 1.0000 | ORAL_TABLET | ORAL | Status: DC | PRN

## 2017-10-09 NOTE — Progress Notes (Signed)
LABOR PROGRESS NOTE  Nitya Jones-Garrett is a 31 y.o. G2P0010 at 2070w1d  admitted for IOL for A2DM   Subjective: Patient comfortable with epidural   Objective: BP (!) 112/49   Pulse 90   Temp 98 F (36.7 C) (Oral)   Resp 16   Ht 5\' 2"  (1.575 m)   Wt 252 lb (114.3 kg)   LMP 01/08/2017 (Exact Date)   SpO2 100%   BMI 46.09 kg/m  or  Vitals:   10/09/17 1200 10/09/17 1201 10/09/17 1205 10/09/17 1206  BP:  112/60 (!) 112/49 (!) 112/49  Pulse:  87 90 90  Resp:  16 16 16   Temp:      TempSrc:      SpO2: 100%     Weight:      Height:       SROM @ 1200- light meconium  Dilation: 4.5 Effacement (%): 90 Station: 0 Presentation: Vertex Exam by:: S Nix RN FHT: baseline rate 125, moderate varibility, + acel, variable decel Toco: 2-4  Labs: Lab Results  Component Value Date   WBC 9.8 10/09/2017   HGB 12.1 10/09/2017   HCT 35.0 (L) 10/09/2017   MCV 94.6 10/09/2017   PLT 315 10/09/2017   CBG (last 3)  Recent Labs    10/09/17 1207 10/09/17 1233 10/09/17 1301  GLUCAP 53* 51* 68  - patient asymptomatic with hypoglycemia, juice given by RN for elevation    Patient Active Problem List   Diagnosis Date Noted  . Encounter for induction of labor 10/09/2017  . Gestational hypertension, third trimester 09/17/2017  . 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 Gestational diabetes mellitus, class A2 07/16/2017  . Fetal echogenic intracardiac focus on prenatal ultrasound 05/15/2017  . Supervision of high risk pregnancy, antepartum 03/17/2017    Assessment / Plan: 31 y.o. G2P0010 at 7070w1d here for IOL for A2DM   Labor: Progressing well on pitocin titration  Fetal Wellbeing:  Cat 2 Pain Control:  Epidural Anticipated MOD:  SVD   Sharyon CableRogers, Fatin Bachicha C, CNM 10/09/2017, 1:10 PM

## 2017-10-09 NOTE — Progress Notes (Signed)
Tiffany Dyer is a 31 y.o. G2P0010 at [redacted]w[redacted]d by LMP admitted for induction of labor due to Gestational diabetes.  Subjective:   Objective: BP (!) 95/59   Pulse (!) 111   Temp (!) 100.7 F (38.2 C) (Oral)   Resp 14   Ht 5\' 2"  (1.575 m)   Wt 252 lb (114.3 kg)   LMP 01/08/2017 (Exact Date)   SpO2 100%   BMI 46.09 kg/m  I/O last 3 completed shifts: In: -  Out: 1425 [Urine:1425] No intake/output data recorded.  FHT:  FHR: 150's bpm, variability: minimal ,  accelerations:  Abscent,  decelerations:  Present variables with good return to baseline before amnioinfusion, Dr. Debroah LoopArnold notified.  Amnioinfusion started UC:   irregular, every 2-5 minutes SVE:   Dilation: 5.5 Effacement (%): 90 Station: -1 Exam by:: Paris Lore Sullivan RN  Labs: Lab Results  Component Value Date   WBC 9.8 10/09/2017   HGB 12.1 10/09/2017   HCT 35.0 (L) 10/09/2017   MCV 94.6 10/09/2017   PLT 315 10/09/2017    Assessment / Plan: Induction of labor due to chorioamnioitis and gestational diabetes,  progressing well on pitocin  Labor: Progressing normally Preeclampsia:  no signs or symptoms of toxicity Fetal Wellbeing:  Category II Pain Control:  Epidural I/D:  n/a Anticipated MOD:  NSVD with NICU for Chorio and Mild Mec planned.   Roe Coombsachelle A Niels Cranshaw, CNM 10/09/2017, 11:07 PM

## 2017-10-09 NOTE — Progress Notes (Signed)
CRITICAL VALUE ALERT  Critical Value:  CBG 53  Date & Time Notied:  10/09/17 at 1211  Provider Notified: Hinda LenisKim ShawCNM  Orders Received/Actions taken: container of juice given; pt asymptomatic; will recheck CBG  per protocol

## 2017-10-09 NOTE — Anesthesia Procedure Notes (Signed)
Epidural Patient location during procedure: OB  Staffing Anesthesiologist: Anniebelle Devore, MD  Preanesthetic Checklist Completed: patient identified, pre-op evaluation, timeout performed, IV checked, risks and benefits discussed and monitors and equipment checked  Epidural Patient position: sitting Prep: DuraPrep Patient monitoring: blood pressure and continuous pulse ox Approach: right paramedian Location: L3-L4 Injection technique: LOR air  Needle:  Needle type: Tuohy  Needle gauge: 17 G Needle insertion depth: 6 cm Catheter type: closed end flexible Catheter size: 19 Gauge Catheter at skin depth: 12 cm Test dose: negative  Assessment Sensory level: T8  Additional Notes   Dosing of Epidural:  1st dose, through catheter .............................................  Xylocaine 40 mg  2nd dose, through catheter, after waiting 3 minutes.........Xylocaine 60 mg    As each dose occurred, patient was free of IV sx; and patient exhibited no evidence of SA injection.  Patient is more comfortable after epidural dosed. Please see RN's note for documentation of vital signs,and FHR which are stable.  Patient reminded not to try to ambulate with numb legs, and that an RN must be present when she attempts to get up.          

## 2017-10-09 NOTE — H&P (Signed)
LABOR AND DELIVERY ADMISSION HISTORY AND PHYSICAL NOTE  Tiffany Dyer is a 31 y.o. female G2P0010 with IUP at 933w1d by U/S at 7wk presenting for IOL for A2DM.  She reports positive fetal movement. She denies leakage of fluid or vaginal bleeding. She reports some irregular contractions.  Prenatal History/Complications: PNC at [facility] Pregnancy complications:  - A2DM   Past Medical History: Past Medical History:  Diagnosis Date  . Genital herpes 2008  . Gestational diabetes   . Pregnancy induced hypertension     Past Surgical History: Past Surgical History:  Procedure Laterality Date  . INDUCED ABORTION    . WISDOM TOOTH EXTRACTION      Obstetrical History: OB History    Gravida Para Term Preterm AB Living   2       1     SAB TAB Ectopic Multiple Live Births                  Social History: Social History   Socioeconomic History  . Marital status: Married    Spouse name: None  . Number of children: None  . Years of education: None  . Highest education level: None  Social Needs  . Financial resource strain: None  . Food insecurity - worry: None  . Food insecurity - inability: None  . Transportation needs - medical: None  . Transportation needs - non-medical: None  Occupational History  . None  Tobacco Use  . Smoking status: Never Smoker  . Smokeless tobacco: Never Used  Substance and Sexual Activity  . Alcohol use: No  . Drug use: No  . Sexual activity: Yes    Birth control/protection: None  Other Topics Concern  . None  Social History Narrative  . None    Family History: Family History  Problem Relation Age of Onset  . Other Paternal Grandfather        poor circulation  . Thyroid disease Paternal Grandfather   . Diabetes Paternal Grandmother   . Hypertension Maternal Grandmother   . Cancer Maternal Grandfather        kidney  . Diabetes Father     Allergies: No Known Allergies  Medications Prior to Admission  Medication Sig Dispense  Refill Last Dose  . acyclovir (ZOVIRAX) 400 MG tablet Take 1 tablet (400 mg total) 3 (three) times daily by mouth. 90 tablet 2 10/09/2017 at 0615  . Cetirizine HCl (ZYRTEC ALLERGY PO) Take 10 mg by mouth daily as needed.    10/08/2017 at Unknown time  . glyBURIDE (DIABETA) 2.5 MG tablet Take 1 tablet (2.5 mg total) at bedtime by mouth. (Patient taking differently: Take at bedtime by mouth. Takes half a pill) 30 tablet 3 10/08/2017 at Unknown time  . metFORMIN (GLUCOPHAGE) 500 MG tablet 1 tablet with breakfast and 2 tablets with dinner 90 tablet 1 10/09/2017 at Unknown time  . omeprazole (PRILOSEC) 20 MG capsule Take 1 capsule (20 mg total) by mouth daily. 1 tablet a day 30 capsule 6 10/08/2017 at Unknown time  . Prenatal Vit-Fe Fumarate-FA (PRENATAL VITAMIN PO) Take 2 tablets by mouth daily. One a day brand   10/08/2017 at Unknown time  . promethazine (PHENERGAN) 12.5 MG tablet TAKE 1 TABLET BY MOUTH EVERY 6 HOURS AS NEEDED FOR NAUSEA AND VOMITING 30 tablet 1 10/08/2017 at Unknown time  . clotrimazole-betamethasone (LOTRISONE) cream Apply 1 application topically 2 (two) times daily. (Patient taking differently: Apply 1 application as needed topically. ) 30 g 1 prn  . ondansetron (  ZOFRAN) 4 MG tablet Take 1 tablet (4 mg total) by mouth every 8 (eight) hours as needed for nausea or vomiting. 40 tablet 2 prn     Review of Systems  All systems reviewed and negative except as stated in HPI  Physical Exam Blood pressure (!) 112/98, pulse 84, temperature 98 F (36.7 C), temperature source Oral, resp. rate 18, height 5\' 2"  (1.575 m), weight 252 lb (114.3 kg), last menstrual period 01/08/2017. General appearance: alert, cooperative and no distress Lungs: clear to auscultation bilaterally Heart: regular rate and rhythm Abdomen: soft, non-tender; bowel sounds normal Extremities: No calf swelling or tenderness Presentation: cephalic by cervical exam  Fetal monitoring: baseline 135, moderate variability, +accel  with no decel  Uterine activity: irreg contractions  Dilation: 2 Effacement (%): 80 Station: -2 Exam by:: Lanice Shirts CNM  Prenatal labs: ABO, Rh: A/Positive/-- (05/15 1624) Antibody: Negative (09/11 0851) Rubella: 1.83 (05/15 1624) RPR: Non Reactive (09/11 0851)  HBsAg: Negative (05/15 1624)  HIV:   Non reactive (09/11) GC/Chlamydia: Negative (11/15) GBS: Negative (11/15 1500)  2 hr Glucola: abnormal - GDM Genetic screening:  negative Anatomy US: Isolated Lt EICF, otherwise normal   [facility]  Initiated Care at  [facility]  FOB Gala Murdoch  Dating By LMP, U/S 7wk  Pap 03/17/17 neg  GC/CT Initial: -/-               36+wks:  -/-  Genetic Screen NT/IT: neg  CF screen declined  Anatomic [place] Isolated Lt EICF, otherwise normal  Flu vaccine Given 09/01/17   Tdap Recommended ~ 28wks  Glucose Screen  2 hr abnormal: 121/219/201>GDM  GBS neg  Feed Preference breast  Contraception Undecided, discussed  [facility]cision Yes, at [facility]  Childbirth Classes Scheduled Nov 9&10  Pediatrician List given    Prenatal Transfer Tool  Maternal Diabetes: Yes:  Diabetes Type:  Insulin/Medication controlled -metformin  Genetic Screening: Normal Maternal Ultrasounds/Referrals: Normal Fetal Ultrasounds or other Referrals:  None Maternal Substance Abuse:  No Significant Maternal Medications:  None Significant Maternal Lab Results: Lab values include: Group B Strep negative  Results for orders placed or performed during the hospital encounter of 10/09/17 (from the past 24 hour(s))  CBC   Collection Time: 10/09/17  8:15 AM  Result Value Ref Range   WBC 9.8 4.0 - 10.5 K/uL   RBC 3.70 (L) 3.87 - 5.11 MIL/uL   Hemoglobin 12.1 12.0 - 15.0 g/dL   HCT 40.9 (L) 81.1 - 91.4 %   MCV 94.6 78.0 - 100.0 fL   MCH 32.7 26.0 - 34.0 pg   MCHC 34.6 30.0 - 36.0 g/dL   RDW 78.2 95.6 - 21.3 %   Platelets 315 150 - 400 K/uL  Glucose, random   Collection Time: 10/09/17  8:15 AM  Result Value Ref Range    Glucose, Bld 92 65 - 99 mg/dL    Patient Active Problem List   Diagnosis Date Noted  . Encounter for induction of labor 10/09/2017  . Gestational hypertension, third trimester 09/17/2017  . 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 Gestational diabetes mellitus, class A2 07/16/2017  . Fetal echogenic intracardiac focus on prenatal ultrasound 05/15/2017  . Supervision of high risk pregnancy, antepartum 03/17/2017    Assessment: Adelie Dyer is a 31 y.o. G2P0010 at [redacted]w[redacted]d here for IOL for A2DM   #Labor: IOL on pitocin  #Pain: IV fentanyl plans epidural  #FWB: Cat 1 #ID:  GBS neg  #MOF: Breast #MOC: Undecided  #Circ:  Yes, inpatient  Sharyon Cableogers, Gayna Braddy C, CNM 10/09/2017, 10:22 AM

## 2017-10-09 NOTE — Anesthesia Preprocedure Evaluation (Signed)
Anesthesia Evaluation  Patient identified by MRN, date of birth, ID band Patient awake    Reviewed: Allergy & Precautions, H&P , Patient's Chart, lab work & pertinent test results  Airway Mallampati: II  TM Distance: >3 FB Neck ROM: full    Dental no notable dental hx.    Pulmonary    Pulmonary exam normal breath sounds clear to auscultation       Cardiovascular Exercise Tolerance: Good hypertension,  Rhythm:regular Rate:Normal     Neuro/Psych    GI/Hepatic   Endo/Other  diabetes, Gestational  Renal/GU      Musculoskeletal   Abdominal   Peds  Hematology   Anesthesia Other Findings   Reproductive/Obstetrics                             Anesthesia Physical Anesthesia Plan  ASA: III  Anesthesia Plan: Epidural   Post-op Pain Management:    Induction:   PONV Risk Score and Plan:   Airway Management Planned:   Additional Equipment:   Intra-op Plan:   Post-operative Plan:   Informed Consent: I have reviewed the patients History and Physical, chart, labs and discussed the procedure including the risks, benefits and alternatives for the proposed anesthesia with the patient or authorized representative who has indicated his/her understanding and acceptance.   Dental Advisory Given  Plan Discussed with:   Anesthesia Plan Comments: (Labs checked- platelets confirmed with RN in room. Fetal heart tracing, per RN, reported to be stable enough for sitting procedure. Discussed epidural, and patient consents to the procedure:  included risk of possible headache,backache, failed block, allergic reaction, and nerve injury. This patient was asked if she had any questions or concerns before the procedure started.)        Anesthesia Quick Evaluation

## 2017-10-09 NOTE — Progress Notes (Signed)
Interval progress note:  IUPC placed @ 1332 FSE placed @ 1335  While repositioning patient to right side, prolonged deceleration occurred while present at beside. Occurred for 5 minutes with nadir at 55. [facility]   Methods during deceleration: pitocin discontinued, IV fluid bolus, oxygen applied, position changes, CBG level checked. Baby recovered with patient in hands and knees.   Cervical exam: Dilation: 5.5 Effacement (%): 90 Station: -1 Presentation: Vertex Exam by:: V Tiffany Dyer CNM  CBG (last 3)  Recent Labs    10/09/17 1233 10/09/17 1301 10/09/17 1346  GLUCAP 51* 68 60*   Plan: restart pitocin at 1430 once reactive with no decelerations

## 2017-10-09 NOTE — Progress Notes (Signed)
Maryana Jones-Garrett is a 31 y.o. G2P0010 at 5997w1d.  Subjective: Overnight team made rounds to introduce ourselves.   Patient is feeling comfortable w/ no specific complaints.  Objective: BP (!) 115/56   Pulse (!) 114   Temp 99.9 F (37.7 C) (Oral)   Resp 14   Ht 5\' 2"  (1.575 m)   Wt 114.3 kg (252 lb)   LMP 01/08/2017 (Exact Date)   SpO2 100%   BMI 46.09 kg/m    FHT:  FHR: 155 bpm, variability: appropriate,  accelerations:  10x10,  Decelerations: variable UC:   Q 1.5-205minutes, 50-70 Dilation: 5.5 Effacement (%): 90 Station: -1 Presentation: Vertex Exam by:: S Nix RN  Labs: Results for orders placed or performed during the hospital encounter of 10/09/17 (from the past 24 hour(s))  CBC     Status: Abnormal   Collection Time: 10/09/17  8:15 AM  Result Value Ref Range   WBC 9.8 4.0 - 10.5 K/uL   RBC 3.70 (L) 3.87 - 5.11 MIL/uL   Hemoglobin 12.1 12.0 - 15.0 g/dL   HCT 16.135.0 (L) 09.636.0 - 04.546.0 %   MCV 94.6 78.0 - 100.0 fL   MCH 32.7 26.0 - 34.0 pg   MCHC 34.6 30.0 - 36.0 g/dL   RDW 40.915.0 81.111.5 - 91.415.5 %   Platelets 315 150 - 400 K/uL  RPR     Status: None   Collection Time: 10/09/17  8:15 AM  Result Value Ref Range   RPR Ser Ql Non Reactive Non Reactive  Type and screen     Status: None   Collection Time: 10/09/17  8:15 AM  Result Value Ref Range   ABO/RH(D) A POS    Antibody Screen NEG    Sample Expiration 10/12/2017   Glucose, random     Status: None   Collection Time: 10/09/17  8:15 AM  Result Value Ref Range   Glucose, Bld 92 65 - 99 mg/dL  ABO/Rh     Status: None   Collection Time: 10/09/17  8:15 AM  Result Value Ref Range   ABO/RH(D) A POS   Glucose, capillary     Status: Abnormal   Collection Time: 10/09/17 12:07 PM  Result Value Ref Range   Glucose-Capillary 53 (L) 65 - 99 mg/dL  Glucose, capillary     Status: Abnormal   Collection Time: 10/09/17 12:33 PM  Result Value Ref Range   Glucose-Capillary 51 (L) 65 - 99 mg/dL  Glucose, capillary     Status: None    Collection Time: 10/09/17  1:01 PM  Result Value Ref Range   Glucose-Capillary 68 65 - 99 mg/dL  Glucose, capillary     Status: Abnormal   Collection Time: 10/09/17  1:46 PM  Result Value Ref Range   Glucose-Capillary 60 (L) 65 - 99 mg/dL  Glucose, capillary     Status: None   Collection Time: 10/09/17  2:49 PM  Result Value Ref Range   Glucose-Capillary 85 65 - 99 mg/dL  Glucose, capillary     Status: Abnormal   Collection Time: 10/09/17  6:02 PM  Result Value Ref Range   Glucose-Capillary 53 (L) 65 - 99 mg/dL  Glucose, capillary     Status: None   Collection Time: 10/09/17  6:54 PM  Result Value Ref Range   Glucose-Capillary 87 65 - 99 mg/dL    Assessment / Plan: 4897w1d week IUP Labor: pitocin Fetal Wellbeing:  Category 1 Pain Control:  epidural Anticipated MOD:  nsvd  Marthenia RollingBland, Eulan Heyward, DO  10/09/2017 8:48 PM

## 2017-10-09 NOTE — Anesthesia Pain Management Evaluation Note (Signed)
  CRNA Pain Management Visit Note  Patient: Tiffany Dyer, 31 y.o., female  "Hello [facility]. We have an anesthesia team available at all times to provide care throughout the hospital, including epidural management and anesthesia for C-section. I don't know your plan for the delivery whether it a natural birth, water birth, IV sedation, nitrous supplementation, doula or epidural, but we want to meet your pain goals."   1.Was your pain managed to your expectations on prior hospitalizations?   No   2.What is your expectation for pain management during this hospitalization?     Epidural  3.How can we help you reach that goal? Pt currently comfortable with epidural  Record the patient's initial score and the patient's pain goal.   Pain: 0  Pain Goal: 0 [facility] wants you to be able to say your pain was always managed very well.  Dayanna Pryce 10/09/2017

## 2017-10-09 NOTE — Progress Notes (Signed)
CRITICAL VALUE ALERT  Critical Value:  CBG 53  Date & Time Notied:  10/09/17  Provider Notified: Steward DroneVeronica Rogers CNM  Orders Received/Actions taken: [facility]

## 2017-10-09 NOTE — Progress Notes (Signed)
CRITICAL VALUE ALERT  Critical Value:  XBG 60  Date & Time Notied:  1346  Provider Notified: [name] CNM  Orders Received/Actions taken: popsicle given

## 2017-10-10 ENCOUNTER — Encounter (HOSPITAL_COMMUNITY): Payer: Self-pay

## 2017-10-10 DIAGNOSIS — O2412 Pre-existing diabetes mellitus, type 2, in childbirth: Secondary | ICD-10-CM

## 2017-10-10 DIAGNOSIS — E119 Type 2 diabetes mellitus without complications: Secondary | ICD-10-CM

## 2017-10-10 DIAGNOSIS — Z3A39 39 weeks gestation of pregnancy: Secondary | ICD-10-CM

## 2017-10-10 LAB — CBC
HEMATOCRIT: 30.8 % — AB (ref 36.0–46.0)
HEMOGLOBIN: 10.8 g/dL — AB (ref 12.0–15.0)
MCH: 32.4 pg (ref 26.0–34.0)
MCHC: 35.1 g/dL (ref 30.0–36.0)
MCV: 92.5 fL (ref 78.0–100.0)
Platelets: 260 10*3/uL (ref 150–400)
RBC: 3.33 MIL/uL — AB (ref 3.87–5.11)
RDW: 14.6 % (ref 11.5–15.5)
WBC: 15.3 10*3/uL — AB (ref 4.0–10.5)

## 2017-10-10 LAB — GLUCOSE, CAPILLARY
GLUCOSE-CAPILLARY: 82 mg/dL (ref 65–99)
Glucose-Capillary: 182 mg/dL — ABNORMAL HIGH (ref 65–99)
Glucose-Capillary: 115 mg/dL — ABNORMAL HIGH (ref 65–99)
Glucose-Capillary: 96 mg/dL (ref 65–99)

## 2017-10-10 MED ORDER — BENZOCAINE-MENTHOL 20-0.5 % EX AERO
1.0000 "application " | INHALATION_SPRAY | CUTANEOUS | Status: DC | PRN
  Administered 2017-10-10: 1 via TOPICAL
  Filled 2017-10-10 (×2): qty 56

## 2017-10-10 MED ORDER — CETIRIZINE HCL 10 MG PO TABS: 10.0000 mg | ORAL_TABLET | Freq: Every day | ORAL | Status: DC

## 2017-10-10 MED ORDER — DIBUCAINE 1 % RE OINT: 1.0000 "application " | TOPICAL_OINTMENT | RECTAL | Status: DC | PRN

## 2017-10-10 MED ORDER — DIPHENHYDRAMINE HCL 25 MG PO CAPS: 25.0000 mg | ORAL_CAPSULE | Freq: Four times a day (QID) | ORAL | Status: DC | PRN

## 2017-10-10 MED ORDER — OXYCODONE-ACETAMINOPHEN 5-325 MG PO TABS: 2.0000 | ORAL_TABLET | ORAL | Status: DC | PRN

## 2017-10-10 MED ORDER — OXYTOCIN 40 UNITS IN LACTATED RINGERS INFUSION - SIMPLE MED: 2.5000 [IU]/h | INTRAVENOUS | Status: DC | PRN

## 2017-10-10 MED ORDER — METHYLERGONOVINE MALEATE 0.2 MG/ML IJ SOLN: 0.2000 mg | INTRAMUSCULAR | Status: DC | PRN

## 2017-10-10 MED ORDER — COCONUT OIL OIL: 1.0000 "application " | TOPICAL_OIL | Status: DC | PRN

## 2017-10-10 MED ORDER — OXYCODONE-ACETAMINOPHEN 5-325 MG PO TABS: 1.0000 | ORAL_TABLET | ORAL | Status: DC | PRN

## 2017-10-10 MED ORDER — BISACODYL 10 MG RE SUPP: 10.0000 mg | Freq: Every day | RECTAL | Status: DC | PRN

## 2017-10-10 MED ORDER — FERROUS SULFATE 325 (65 FE) MG PO TABS
325.0000 mg | ORAL_TABLET | Freq: Two times a day (BID) | ORAL | Status: DC
  Administered 2017-10-10 – 2017-10-12 (×5): 325 mg via ORAL
  Filled 2017-10-10 (×5): qty 1

## 2017-10-10 MED ORDER — ONDANSETRON HCL 4 MG PO TABS: 4.0000 mg | ORAL_TABLET | ORAL | Status: DC | PRN

## 2017-10-10 MED ORDER — ONDANSETRON HCL 4 MG/2ML IJ SOLN
4.0000 mg | INTRAMUSCULAR | Status: DC | PRN
Start: 2017-10-10 — End: 2017-10-12

## 2017-10-10 MED ORDER — PRENATAL MULTIVITAMIN CH
1.0000 | ORAL_TABLET | Freq: Every day | ORAL | Status: DC
  Administered 2017-10-10 – 2017-10-11 (×2): 1 via ORAL
  Filled 2017-10-10 (×3): qty 1

## 2017-10-10 MED ORDER — IBUPROFEN 600 MG PO TABS
600.0000 mg | ORAL_TABLET | Freq: Four times a day (QID) | ORAL | Status: DC
  Administered 2017-10-10 – 2017-10-12 (×8): 600 mg via ORAL
  Filled 2017-10-10 (×10): qty 1

## 2017-10-10 MED ORDER — FLEET ENEMA 7-19 GM/118ML RE ENEM: 1.0000 | ENEMA | Freq: Every day | RECTAL | Status: DC | PRN

## 2017-10-10 MED ORDER — SIMETHICONE 80 MG PO CHEW: 80.0000 mg | CHEWABLE_TABLET | ORAL | Status: DC | PRN

## 2017-10-10 MED ORDER — MEASLES, MUMPS & RUBELLA VAC ~~LOC~~ INJ
0.5000 mL | INJECTION | Freq: Once | SUBCUTANEOUS | Status: DC
  Filled 2017-10-10: qty 0.5

## 2017-10-10 MED ORDER — SENNOSIDES-DOCUSATE SODIUM 8.6-50 MG PO TABS
2.0000 | ORAL_TABLET | ORAL | Status: DC
  Administered 2017-10-10: 2 via ORAL
  Filled 2017-10-10 (×2): qty 2

## 2017-10-10 MED ORDER — TETANUS-DIPHTH-ACELL PERTUSSIS 5-2.5-18.5 LF-MCG/0.5 IM SUSP
0.5000 mL | Freq: Once | INTRAMUSCULAR | Status: AC
  Administered 2017-10-12: 0.5 mL via INTRAMUSCULAR
  Filled 2017-10-10: qty 0.5

## 2017-10-10 MED ORDER — ACETAMINOPHEN 325 MG PO TABS
650.0000 mg | ORAL_TABLET | ORAL | Status: DC | PRN
  Administered 2017-10-10: 650 mg via ORAL
  Filled 2017-10-10: qty 2

## 2017-10-10 MED ORDER — LORATADINE 10 MG PO TABS
10.0000 mg | ORAL_TABLET | Freq: Every day | ORAL | Status: DC
  Administered 2017-10-10 – 2017-10-12 (×3): 10 mg via ORAL
  Filled 2017-10-10 (×3): qty 1

## 2017-10-10 MED ORDER — METHYLERGONOVINE MALEATE 0.2 MG PO TABS: 0.2000 mg | ORAL_TABLET | ORAL | Status: DC | PRN

## 2017-10-10 MED ORDER — WITCH HAZEL-GLYCERIN EX PADS: 1.0000 "application " | MEDICATED_PAD | CUTANEOUS | Status: DC | PRN

## 2017-10-10 MED ORDER — ZOLPIDEM TARTRATE 5 MG PO TABS: 5.0000 mg | ORAL_TABLET | Freq: Every evening | ORAL | Status: DC | PRN

## 2017-10-10 NOTE — Lactation Note (Signed)
This note was copied from a baby's chart. Lactation Consultation Note  Patient Name: Tiffany Dyer Today's Date: 10/10/2017 Reason for consult: Initial assessment Infant is 1516 hours old & seen by Lactation for Initial Assessment. Baby was born at 2237w2d and weighed 7 lbs 15.3 oz at birth. Baby was skin-to-skin with mom when LC entered. Baby had a low blood sugar of 33 mg/dL at 1:61WR3:49am and per RN note, after no colostrum was able to be expressed, they gave baby 8mL of formula. Mom reports that BF has gone really well since then. Mom reports her nipples are a little tender but feel ok when baby is BF. Encouraged mom to rub expressed breast milk on her nipples after BF. Mom provided with BF booklet, BF resources, and feeding log; mom made aware of O/P services, breastfeeding support groups, community resources, and our phone # for post-discharge questions. Mom encouraged to feed baby 8-12 times/24 hours and with feeding cues. Discussed stomach size, milk volume, skin-to-skin, hand expression, and engorgement prevention & care. Mom reports she has a Medela pump at home and will be going back to work after ~12 wks. Encouraged mom to focus on BF and to start pumping ~2 wks prior to going back to work. Mom reports no questions. Encouraged mom to ask for help as needed.    Maternal Data    Feeding    LATCH Score                   Interventions Interventions: Breast feeding basics reviewed  Lactation Tools Discussed/Used     Consult Status Consult Status: Follow-up Date: 10/11/17 Follow-up type: In-patient    Oneal GroutLaura C Aldwin Micalizzi 10/10/2017, 5:57 PM

## 2017-10-10 NOTE — Anesthesia Postprocedure Evaluation (Signed)
Anesthesia Post Note  Patient: Tiffany Dyer  Procedure(s) Performed: AN AD HOC LABOR EPIDURAL     Patient location during evaluation: Mother Baby Anesthesia Type: Epidural Level of consciousness: awake and alert Pain management: pain level controlled Vital Signs Assessment: post-procedure vital signs reviewed and stable Respiratory status: spontaneous breathing, nonlabored ventilation and respiratory function stable Cardiovascular status: stable Postop Assessment: no headache, no backache, epidural receding and patient able to bend at knees Anesthetic complications: no    Last Vitals:  Vitals:   10/10/17 0915 10/10/17 1740  BP: (!) 114/58 (!) 123/53  Pulse: 88 79  Resp: 16 20  Temp: (!) 36.3 C 36.7 C  SpO2: 99%     Last Pain:  Vitals:   10/10/17 1740  TempSrc:   PainSc: 0-No pain   Pain Goal: Patients Stated Pain Goal: 2 (10/10/17 1217)               Rica RecordsICKELTON,Tanaia Hawkey

## 2017-10-11 LAB — GLUCOSE, CAPILLARY: Glucose-Capillary: 101 mg/dL — ABNORMAL HIGH (ref 65–99)

## 2017-10-11 NOTE — Progress Notes (Signed)
POSTPARTUM PROGRESS NOTE  Post Partum Day 1 Subjective:  Tiffany Dyer is a 31 y.o. G2P1011 [redacted]w[redacted]d s/p SVD.  No acute events overnight.  Pt denies problems with ambulating, voiding or po intake.  She denies nausea or vomiting.  Pain is well controlled. Lochia Minimal.   Objective: Blood pressure 121/71, pulse 75, temperature (!) 97.5 F (36.4 C), temperature source Oral, resp. rate 16, height 5\' 2"  (1.575 m), weight 248 lb 8 oz (112.7 kg), last menstrual period 01/08/2017, SpO2 99 %, unknown if currently breastfeeding.  Physical Exam:  General: alert, cooperative and no distress Lochia:normal flow Chest: no respiratory distress Heart:regular rate, distal pulses intact Abdomen: soft, nontender,  Uterine Fundus: firm, appropriately tender DVT Evaluation: No calf swelling or tenderness Extremities: no edema  Recent Labs    10/09/17 0815 10/10/17 0218  HGB 12.1 10.8*  HCT 35.0* 30.8*    Assessment/Plan:  ASSESSMENT: Tiffany Dyer is a 31 y.o. G2P1011 [redacted]w[redacted]d s/p SVD.  Plan for discharge tomorrow   LOS: 2 days   Tyquisha Sharps MossDO 10/11/2017, 6:11 AM

## 2017-10-12 MED ORDER — IBUPROFEN 600 MG PO TABS: 600.0000 mg | ORAL_TABLET | Freq: Four times a day (QID) | ORAL | 0 refills | Status: DC

## 2017-10-12 NOTE — Lactation Note (Signed)
This note was copied from a baby's chart. Lactation Consultation Note  Patient Name: Tiffany Dyer Today's Date: 10/12/2017 Reason for consult: Follow-up assessment;Term;Infant weight loss(5% weight loss )  Baby is 1356 hours old.  As LC entered the room baby awake and fussy.  LC offered to assist, mom receptive.  LC assisted with latch , please see below.  LC reviewed teaching and basics for latching.  Positional strips noted on the top edges of both nipples.  LC instructed on the use of comfort gels , breast shells, and hand pump.  Sore nipple and engorgement prevention and tx. Per mom has  DEBP  At home.  Mother informed of post-discharge support and given phone number to the lactation department, including services for phone call assistance; out-patient appointments; and breastfeeding support group. List of other breastfeeding resources in the community given in the handout. Encouraged mother to call for problems or concerns related to breastfeeding.   Maternal Data Has patient been taught Hand Expression?: Yes Does the patient have breastfeeding experience prior to this delivery?: No  Feeding Feeding Type: Breast Fed Length of feed: (increased swallows / breast compressions , still feeding at 20 mins )  LATCH Score Latch: Grasps breast easily, tongue down, lips flanged, rhythmical sucking.  Audible Swallowing: A few with stimulation(increased to 2 )  Type of Nipple: Everted at rest and after stimulation(semi  compressible areolas )  Comfort (Breast/Nipple): Soft / non-tender  Hold (Positioning): Assistance needed to correctly position infant at breast and maintain latch.  LATCH Score: 8  Interventions Interventions: Breast feeding basics reviewed;Assisted with latch;Skin to skin;Breast massage;Hand express;Reverse pressure;Breast compression;Adjust position;Support pillows;Position options;Expressed milk;Shells;DEBP;Hand pump  Lactation Tools  Discussed/Used Tools: Shells;Pump;Comfort gels Shell Type: Inverted Breast pump type: Double-Electric Breast Pump;Manual WIC Program: No Pump Review: Setup, frequency, and cleaning Initiated by:: MAI (reviewed ) Date initiated:: 10/12/17   Consult Status Consult Status: Complete Date: 10/12/17    Tiffany Dyer 10/12/2017, 10:31 AM

## 2017-10-12 NOTE — Discharge Summary (Signed)
OB Discharge Summary     Patient Name: Tiffany Dyer DOB: [birth year] MRN: [id number] Date of admission: 10/09/2017  Delivering MD: Orvilla CornwallENNEY, RACHELLE A )  Date of discharge: 10/12/2017    Admitting diagnosis: pregnancy at [redacted] weeks gestation, gHTN, A2GDM Intrauterine pregnancy: 494w2d    Secondary diagnosis:  Active Problems:   Patient Active Problem List   Diagnosis Date Noted  . NSVD (normal spontaneous vaginal delivery) 10/10/2017  . Encounter for induction of labor 10/09/2017  . Gestational hypertension, third trimester 09/17/2017  . 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 Gestational diabetes mellitus, class A2 07/16/2017  . Fetal echogenic intracardiac focus on prenatal ultrasound 05/15/2017  . Supervision of high risk pregnancy, antepartum 03/17/2017    Additional problems: none     Discharge diagnosis: Term Pregnancy Delivered                                                                                                Post partum procedures:none  Complications: None  Hospital course:  Induction of Labor With Vaginal Delivery   31 y.o. yo G2P1011 at 124w2d was admitted to the hospital 10/09/2017 for induction of labor.  Indication for induction: Gestational hypertension and A2 DM.  Patient had an uncomplicated labor course as follows: Membrane Rupture Time/Date: 12:00 PM ,10/09/2017   Intrapartum Procedures: Episiotomy: None [1]                                         Lacerations:  2nd degree [3];Perineal [11]  Patient had delivery of a Viable infant.  Information for the patient's newborn:  Tiffany Dyer, Boy Tiffany Dyer [[id number]][[id number]]  Delivery Method: Vaginal, Spontaneous(Filed from Delivery Summary)   10/10/2017  Details of delivery can be found in separate delivery note.  Patient had a routine postpartum course. Patient is discharged home 10/12/17.  Physical exam  Vitals:   10/11/17 0554 10/11/17 1700  BP: 121/71 118/68  Pulse: 75 74  Resp: 16 20  Temp: (!) 97.5 F (36.4  C) 98 F (36.7 C)  SpO2:      General: alert, cooperative and no distress Lochia: appropriate Uterine Fundus: firm Incision: N/A DVT Evaluation: No evidence of DVT seen on physical exam.  Labs: Results for orders placed or performed during the hospital encounter of 10/09/17 (from the past 24 hour(s))  Glucose, capillary     Status: Abnormal   Collection Time: 10/11/17  6:37 AM  Result Value Ref Range   Glucose-Capillary 101 (H) 65 - 99 mg/dL     Discharge instruction: per After Visit Summary and "Baby and Me Booklet".  After visit meds:  No Known Allergies  Allergies as of 10/12/2017   No Known Allergies     Medication List    STOP taking these medications   clotrimazole-betamethasone cream Commonly known as:  LOTRISONE   glyBURIDE 2.5 MG tablet Commonly known as:  DIABETA   metFORMIN 500 MG tablet Commonly known as:  GLUCOPHAGE   omeprazole 20 MG capsule Commonly known as:  PRILOSEC   ondansetron  4 MG tablet Commonly known as:  ZOFRAN   promethazine 12.5 MG tablet Commonly known as:  PHENERGAN   ZYRTEC ALLERGY PO     TAKE these medications   ibuprofen 600 MG tablet Commonly known as:  ADVIL,MOTRIN Take 1 tablet (600 mg total) by mouth every 6 (six) hours.   PRENATAL VITAMIN PO Take 2 tablets by mouth daily. One a day brand        Diet: carb modified diet  Activity: Advance as tolerated. Pelvic rest for 6 weeks.   Outpatient follow up:1 week for blood pressure check and 6 weeks for repeat GTT Future Appointments:  Future Appointments  Date Time Provider Department Center  10/19/2017 11:30 AM Lazaro ArmsEure, Luther H, MD [facility]    Follow up Appt: No Follow-up on file.     Postpartum contraception: undecided  Newborn Data: APGAR (1 MIN): 8   APGAR (5 MINS): 9     Baby Feeding: Breast Disposition:home with mother  Rolm Bookbindermber Bernie Fobes, DO  10/12/2017

## 2017-10-19 ENCOUNTER — Ambulatory Visit (INDEPENDENT_AMBULATORY_CARE_PROVIDER_SITE_OTHER): Payer: Managed Care, Other (non HMO) | Admitting: Obstetrics & Gynecology

## 2017-10-19 ENCOUNTER — Encounter: Payer: Self-pay | Admitting: Obstetrics & Gynecology

## 2017-10-19 ENCOUNTER — Other Ambulatory Visit: Payer: Self-pay

## 2017-10-19 VITALS — BP 142/90 | HR 81 | Ht 62.0 in | Wt 242.0 lb

## 2017-10-19 DIAGNOSIS — Z0130 Encounter for examination of blood pressure without abnormal findings: Secondary | ICD-10-CM

## 2017-10-19 NOTE — Progress Notes (Signed)
   Tiffany Dyer is doing well 9 days post partum BP is good today  Breast feeding Blood pressure (!) 142/90, pulse 81, height 5\' 2"  (1.575 m), weight 242 lb (109.8 kg), last menstrual period 01/08/2017, currently breastfeeding.   Needs post partum visit 5 weeks 2 hour GTT at 12 weeks postpartum Follow up prn

## 2017-11-23 ENCOUNTER — Ambulatory Visit: Payer: Managed Care, Other (non HMO) | Admitting: Women's Health

## 2017-11-26 ENCOUNTER — Ambulatory Visit (INDEPENDENT_AMBULATORY_CARE_PROVIDER_SITE_OTHER): Payer: Managed Care, Other (non HMO) | Admitting: Women's Health

## 2017-11-26 ENCOUNTER — Encounter: Payer: Self-pay | Admitting: Women's Health

## 2017-11-26 DIAGNOSIS — Z3202 Encounter for pregnancy test, result negative: Secondary | ICD-10-CM

## 2017-11-26 DIAGNOSIS — N898 Other specified noninflammatory disorders of vagina: Secondary | ICD-10-CM

## 2017-11-26 DIAGNOSIS — O165 Unspecified maternal hypertension, complicating the puerperium: Secondary | ICD-10-CM | POA: Diagnosis not present

## 2017-11-26 DIAGNOSIS — Z8632 Personal history of gestational diabetes: Secondary | ICD-10-CM | POA: Diagnosis not present

## 2017-11-26 DIAGNOSIS — Z8759 Personal history of other complications of pregnancy, childbirth and the puerperium: Secondary | ICD-10-CM | POA: Diagnosis not present

## 2017-11-26 LAB — POCT WET PREP (WET MOUNT)
Clue Cells Wet Prep Whiff POC: POSITIVE
TRICHOMONAS WET PREP HPF POC: ABSENT

## 2017-11-26 LAB — POCT URINE PREGNANCY: Preg Test, Ur: NEGATIVE

## 2017-11-26 MED ORDER — NORETHINDRONE 0.35 MG PO TABS: 1.0000 | ORAL_TABLET | Freq: Every day | ORAL | 11 refills | Status: DC

## 2017-11-26 MED ORDER — AMLODIPINE BESYLATE 5 MG PO TABS: 5.0000 mg | ORAL_TABLET | Freq: Every day | ORAL | 0 refills | Status: DC

## 2017-11-26 NOTE — Progress Notes (Signed)
POSTPARTUM VISIT Patient name: Tiffany Dyer MRN [id number]  Date of birth: Mar 05, 1986 Chief Complaint:   postpartum visit (interested in starting birth control pills; constipation)  History of Present Illness:   Tiffany Dyer is a 32 y.o. 142P1011 African American female being seen today for a postpartum visit. She is 6 weeks postpartum following a spontaneous vaginal delivery at 39.2 gestational weeks after IOL d/t A2DM, GHTN. Anesthesia: epidural. I have fully reviewed the prenatal and intrapartum course. Pregnancy complicated by A2DM, GHTN. Postpartum course has been complicated by PP HTN, no meds. Bleeding no bleeding. Bowel function is constipation. Bladder function is normal. Reports vaginal odor, no itching/irritation.  Patient is not sexually active. Last sexual activity: prior to birth of baby.  Contraception method is wants POPs.  [place] Postpartum Depression Screening: negative. Score 1.   Last pap 03/17/17.  Results were normal .  No LMP recorded.  Baby's course has been uncomplicated. Baby is feeding by breast.  Review of Systems:   Pertinent items are noted in HPI Denies Abnormal vaginal discharge w/ itching/odor/irritation, headaches, visual changes, shortness of breath, chest pain, abdominal pain, severe nausea/vomiting, or problems with urination or bowel movements. Pertinent History Reviewed:  Reviewed past medical,surgical, obstetrical and family history.  Reviewed problem list, medications and allergies. OB History  Gravida Para Term Preterm AB Living  2 1 1   1 1   SAB TAB Ectopic Multiple Live Births        0 1    # Outcome Date GA Lbr Len/2nd Weight Sex Delivery Anes PTL Lv  2 Term 10/10/17 3955w2d 25:30 / 00:34 7 lb 15.3 oz (3.609 kg) M Vag-Spont EPI  LIV     Birth Comments: WNL  1 AB 2008             Physical Assessment:   Vitals:   11/26/17 1604  BP: (!) 130/98  Pulse: 73  Weight: 236 lb (107 kg)  Height: 5\' 2"  (1.575 m)  Body mass index  is 43.16 kg/m.       Physical Examination:   General appearance: alert, well appearing, and in no distress  Mental status: alert, oriented to person, place, and time  Skin: warm & dry   Cardiovascular: normal heart rate noted   Respiratory: normal respiratory effort, no distress   Breasts: deferred, no complaints   Abdomen: soft, non-tender   Pelvic: VULVA: normal appearing vulva with no masses, tenderness or lesions, UTERUS: uterus is normal size, shape, consistency and nontender  Rectal: no hemorrhoids  Extremities: no edema       Results for orders placed or performed in visit on 11/26/17 (from the past 24 hour(s))  POCT urine pregnancy   Collection Time: 11/26/17  4:07 PM  Result Value Ref Range   Preg Test, Ur Negative Negative  POCT Wet Prep Mellody Drown(Wet [place])   Collection Time: 11/26/17  4:58 PM  Result Value Ref Range   Source Wet Prep POC vaginal    WBC, Wet Prep HPF POC few    Bacteria Wet Prep HPF POC None (A) Few   BACTERIA WET PREP MORPHOLOGY POC     Clue Cells Wet Prep HPF POC Few (A) None   Clue Cells Wet Prep Whiff POC Positive Whiff    Yeast Wet Prep HPF POC None    KOH Wet Prep POC     Trichomonas Wet Prep HPF POC Absent Absent    Assessment & Plan:  1) Postpartum exam 2) 6 wks s/p SVB after  IOL for A2DM, GHTN 3) Breastfeeding 4) Depression screening 5) Contraception counseling, pt prefers oral progesterone-only contraceptive, Rx micronor w/ 11RF, understands has to take at exact same time daily to be effective, if late taking use condom as back-up  6) PP HTN> rx norvasc 5mg  daily, stop 2d prior to next visit in 3wks 7) Mild BV> discussed tx vs non-tx, will hold off on tx for now as she is asymptomatic and breastfeeding, if develops sx or tired of odor will let us know 8) A2DM during pregnancy> will check 2hr pp GTT @ 12wks  Meds:  Meds ordered this encounter  Medications  . norethindrone (MICRONOR,CAMILA,ERRIN) 0.35 MG tablet    Sig: Take 1 tablet (0.35 mg  total) by mouth daily.    Dispense:  1 Package    Refill:  11    Order Specific Question:   Supervising Provider    Answer:   Despina Hidden, LUTHER H [[id number]]  . amLODipine (NORVASC) 5 MG tablet    Sig: Take 1 tablet (5 mg total) by mouth daily.    Dispense:  30 tablet    Refill:  0    Order Specific Question:   Supervising Provider    Answer:   Lazaro Arms [[id number]]    Follow-up: Return in about 3 weeks (around 12/17/2017) for F/U, then 3wks later for 2hr sugar test (no visit).   Orders Placed This Encounter  Procedures  . POCT urine pregnancy  . POCT [employer] Prep [facility] [place])    Marge Duncans CNM, [facility] 11/26/2017 4:59 PM

## 2017-11-26 NOTE — Patient Instructions (Addendum)
Stop the norvasc (amlodipine) 2 days before your next visit  Constipation  Drink plenty of fluid, preferably water, throughout the day  Eat foods high in fiber such as fruits, vegetables, and grains  Exercise, such as walking, is a good way to keep your bowels regular  Drink warm fluids, especially warm prune juice, or decaf coffee  Eat a 1/2 cup of real oatmeal (not instant), 1/2 cup applesauce, and 1/2-1 cup warm prune juice every day  If needed, you may take Colace (docusate sodium) stool softener once or twice a day to help keep the stool soft. If you are pregnant, wait until you are out of your first trimester (12-14 weeks of pregnancy)  If you still are having problems with constipation, you may take Miralax once daily as needed to help keep your bowels regular.  If you are pregnant, wait until you are out of your first trimester (12-14 weeks of pregnancy)

## 2017-12-02 ENCOUNTER — Encounter: Payer: Self-pay | Admitting: Women's Health

## 2017-12-17 ENCOUNTER — Ambulatory Visit: Payer: Managed Care, Other (non HMO) | Admitting: Women's Health

## 2017-12-21 ENCOUNTER — Other Ambulatory Visit: Payer: Self-pay

## 2017-12-21 ENCOUNTER — Ambulatory Visit (INDEPENDENT_AMBULATORY_CARE_PROVIDER_SITE_OTHER): Payer: Managed Care, Other (non HMO) | Admitting: Women's Health

## 2017-12-21 ENCOUNTER — Encounter: Payer: Self-pay | Admitting: Women's Health

## 2017-12-21 VITALS — BP 134/88 | HR 76 | Ht 62.0 in | Wt 235.0 lb

## 2017-12-21 DIAGNOSIS — O165 Unspecified maternal hypertension, complicating the puerperium: Secondary | ICD-10-CM

## 2017-12-21 DIAGNOSIS — Z0130 Encounter for examination of blood pressure without abnormal findings: Secondary | ICD-10-CM

## 2017-12-21 NOTE — Progress Notes (Signed)
   GYN VISIT Patient name: Tiffany Dyer MRN [id number]  Date of birth: July 03, 1986 Chief Complaint:   Blood Pressure Check  History of Present Illness:   Tiffany Dyer is a 32 y.o. 722P1011 African American female 10wks s/p SVB after IOL for A2DM, GHTN, being seen today for bp check.  At pp visit at 6wks she was placed on norvasc 5mg  daily for PPHTN, stopped taking 2d ago as directed.  Has not started micronor, hasn't had sex yet.   No LMP recorded. The current method of family planning is abstinence and has rx for micronor. Last pap 03/17/17. Results were:  normal Review of Systems:   Pertinent items are noted in HPI Denies fever/chills, dizziness, headaches, visual disturbances, fatigue, shortness of breath, chest pain, abdominal pain, vomiting, abnormal vaginal discharge/itching/odor/irritation, problems with periods, bowel movements, urination, or intercourse unless otherwise stated above.  Pertinent History Reviewed:  Reviewed past medical,surgical, social, obstetrical and family history.  Reviewed problem list, medications and allergies. Physical Assessment:   Vitals:   12/21/17 1136  BP: 134/88  Pulse: 76  Weight: 235 lb (106.6 kg)  Height: 5\' 2"  (1.575 m)  Body mass index is 42.98 kg/m.       Physical Examination:   General appearance: alert, well appearing, and in no distress  Mental status: alert, oriented to person, place, and time  Skin: warm & dry   Cardiovascular: normal heart rate noted  Respiratory: normal respiratory effort, no distress  Abdomen: soft, non-tender   Pelvic: examination not indicated  Extremities: no edema   No results found for this or any previous visit (from the past 24 hour(s)).  Assessment & Plan:  1) PP HTN> resolved, do not restart norvasc, will have bp check w/ nurse on 3/4  2) A2DM during pregnancy> coming 3/4 for pp 2hr GTT  Meds: No orders of the defined types were placed in this encounter.   No orders of the defined  types were placed in this encounter.   Return for As scheduled 3/4 for 2hr GTT, add bp check w/ nurse please.  Cheral MarkerKimberly R Treyvin Glidden CNM, [facility]NP-[facility] 12/21/2017 11:57 AM

## 2017-12-21 NOTE — Patient Instructions (Signed)
You will have your sugar test next visit.  Please do not eat or drink anything after midnight the night before you come, not even water.  You will be here for at least two hours.    

## 2017-12-26 ENCOUNTER — Other Ambulatory Visit: Payer: Self-pay | Admitting: Women's Health

## 2018-01-04 ENCOUNTER — Other Ambulatory Visit: Payer: Managed Care, Other (non HMO)

## 2018-02-25 ENCOUNTER — Telehealth (HOSPITAL_COMMUNITY): Payer: Self-pay | Admitting: Lactation Services

## 2018-02-25 NOTE — Telephone Encounter (Signed)
Out going phone call :  Mom call with breast  Feeding questions wondering if breast feeding caused  Teeth sensitivity. She mentioned it had started with tooth and spread to 2 others.  Have already been checked at the dentist and no cavities or issues noted.  Per mom has been consistently taking her PNV daily.  LC recommended checking the dose of Ca+ on the PNV bottles and if it is less than 1000 mg  Recommended daily for breast feeding mother per [facility] Mother's milk to add supplement to  Be total of 1000 mg per day. Also change tooth paste to for teeth sensitivity.  LC encouraged mom to back if not resolved, or with breastfeeding questions.

## 2019-11-04 NOTE — L&D Delivery Note (Signed)
OB/GYN Faculty Practice Delivery Note  Tiffany Dyer is a 34 y.o. G3P1011 s/p vaginal delivery at [redacted]w[redacted]d. She was admitted for IOL secondary to gHTN and A2GDM.  ROM: 4h 72m with clear fluid GBS Status: negative Maximum Maternal Temperature: 98.43F  Labor Progress: Pt was started on pitocin at 1715; FB was placed at 2000. Pt progressed well with up-titration of pitocin. Cervical exam complete at 0248 and pt delivered at 0255 s/p brief second stage as noted below.  Delivery Date/Time: 0255 on 07/22/20 Delivery: Called to room and patient was complete and pushing. Head delivered ROA. No nuchal cord present. Shoulder and body delivered in usual fashion. Infant with spontaneous cry, placed on mother's abdomen, dried and stimulated. Cord clamped x 2 after 1-minute delay, and cut by FOB under my direct supervision. Cord blood drawn. Placenta delivered spontaneously with gentle cord traction. Fundus firm with massage and Pitocin. Labia, perineum, vagina, and cervix were inspected, first degree perineal laceration.  Placenta: intact, 3-vessel cord Complications: none Lacerations: first degree perineal laceration s/p repair EBL: Analgesia: epidural, lidocaine  Infant: female  APGARs 9 & 9  weight pending  Lynnda Shields, MD OB/GYN Fellow, Faculty Practice

## 2019-12-09 ENCOUNTER — Other Ambulatory Visit: Payer: Self-pay

## 2019-12-09 ENCOUNTER — Ambulatory Visit (INDEPENDENT_AMBULATORY_CARE_PROVIDER_SITE_OTHER): Payer: Managed Care, Other (non HMO) | Admitting: *Deleted

## 2019-12-09 VITALS — BP 135/75 | HR 87 | Ht 63.0 in

## 2019-12-09 DIAGNOSIS — Z3201 Encounter for pregnancy test, result positive: Secondary | ICD-10-CM | POA: Diagnosis not present

## 2019-12-09 LAB — POCT URINE PREGNANCY: Preg Test, Ur: POSITIVE — AB

## 2019-12-09 NOTE — Progress Notes (Signed)
Chart reviewed for nurse visit. Agree with plan of care.  Cyril Mourning A, NP 12/09/2019 1:40 PM

## 2019-12-09 NOTE — Progress Notes (Signed)
   NURSE VISIT- PREGNANCY CONFIRMATION   SUBJECTIVE:  Tiffany Dyer is a 34 y.o. G15P1011 female with certain LMP. Patient's last menstrual period was 10/29/2019. Here for pregnancy confirmation.  Home pregnancy test: positive x 1  She reports nausea and mild cramping.  She is taking prenatal vitamins.    OBJECTIVE:  BP 135/75 (BP Location: Left Arm, Patient Position: Sitting, Cuff Size: Large)   Pulse 87   Ht 5\' 3"  (1.6 m)   LMP 10/29/2019   Breastfeeding No   BMI 41.63 kg/m   Appears well, in no apparent distress OB History  Gravida Para Term Preterm AB Living  3 1 1   1 1   SAB TAB Ectopic Multiple Live Births        0 1    # Outcome Date GA Lbr Len/2nd Weight Sex Delivery Anes PTL Lv  3 Current           2 Term 10/10/17 [redacted]w[redacted]d 25:30 / 00:34 7 lb 15.3 oz (3.609 kg) M Vag-Spont EPI  LIV     Birth Comments: WNL  1 AB 2008            Results for orders placed or performed in visit on 12/09/19 (from the past 24 hour(s))  POCT urine pregnancy   Collection Time: 12/09/19 12:30 PM  Result Value Ref Range   Preg Test, Ur Positive (A) Negative    ASSESSMENT: Positive pregnancy test, certain LMP   PLAN: Schedule for dating ultrasound in 1-2 weeks Prenatal vitamins: continue   Nausea medicines: not currently needed   OB packet given: Yes  02/06/20  12/09/2019 1:02 PM

## 2019-12-16 ENCOUNTER — Other Ambulatory Visit: Payer: Self-pay | Admitting: Obstetrics and Gynecology

## 2019-12-16 DIAGNOSIS — O3680X0 Pregnancy with inconclusive fetal viability, not applicable or unspecified: Secondary | ICD-10-CM

## 2019-12-19 ENCOUNTER — Other Ambulatory Visit: Payer: Managed Care, Other (non HMO)

## 2019-12-19 ENCOUNTER — Other Ambulatory Visit: Payer: Self-pay

## 2019-12-19 ENCOUNTER — Ambulatory Visit (INDEPENDENT_AMBULATORY_CARE_PROVIDER_SITE_OTHER): Payer: Managed Care, Other (non HMO)

## 2019-12-19 DIAGNOSIS — O3680X0 Pregnancy with inconclusive fetal viability, not applicable or unspecified: Secondary | ICD-10-CM | POA: Diagnosis not present

## 2019-12-19 DIAGNOSIS — Z3A01 Less than 8 weeks gestation of pregnancy: Secondary | ICD-10-CM

## 2019-12-19 NOTE — Progress Notes (Signed)
[place] 7+2 wks,single IUP with YS,crl 11.43 mm,normal ovaries,fhr 130 bpm

## 2020-01-24 ENCOUNTER — Other Ambulatory Visit: Payer: Managed Care, Other (non HMO)

## 2020-01-24 ENCOUNTER — Encounter: Payer: Managed Care, Other (non HMO) | Admitting: Women's Health

## 2020-01-24 ENCOUNTER — Other Ambulatory Visit: Payer: Self-pay | Admitting: Obstetrics and Gynecology

## 2020-01-24 DIAGNOSIS — Z3682 Encounter for antenatal screening for nuchal translucency: Secondary | ICD-10-CM

## 2020-01-25 ENCOUNTER — Encounter: Payer: Self-pay | Admitting: Advanced Practice Midwife

## 2020-01-25 ENCOUNTER — Ambulatory Visit (INDEPENDENT_AMBULATORY_CARE_PROVIDER_SITE_OTHER): Payer: Managed Care, Other (non HMO)

## 2020-01-25 ENCOUNTER — Ambulatory Visit (INDEPENDENT_AMBULATORY_CARE_PROVIDER_SITE_OTHER): Payer: Managed Care, Other (non HMO) | Admitting: Advanced Practice Midwife

## 2020-01-25 ENCOUNTER — Other Ambulatory Visit: Payer: Self-pay

## 2020-01-25 VITALS — BP 120/75 | HR 87 | Wt 232.0 lb

## 2020-01-25 DIAGNOSIS — Z3A13 13 weeks gestation of pregnancy: Secondary | ICD-10-CM

## 2020-01-25 DIAGNOSIS — Z3480 Encounter for supervision of other normal pregnancy, unspecified trimester: Secondary | ICD-10-CM

## 2020-01-25 DIAGNOSIS — Z3682 Encounter for antenatal screening for nuchal translucency: Secondary | ICD-10-CM

## 2020-01-25 DIAGNOSIS — Z3A12 12 weeks gestation of pregnancy: Secondary | ICD-10-CM

## 2020-01-25 DIAGNOSIS — O09521 Supervision of elderly multigravida, first trimester: Secondary | ICD-10-CM

## 2020-01-25 DIAGNOSIS — O09529 Supervision of elderly multigravida, unspecified trimester: Secondary | ICD-10-CM | POA: Insufficient documentation

## 2020-01-25 DIAGNOSIS — Z8632 Personal history of gestational diabetes: Secondary | ICD-10-CM

## 2020-01-25 DIAGNOSIS — Z8759 Personal history of other complications of pregnancy, childbirth and the puerperium: Secondary | ICD-10-CM

## 2020-01-25 DIAGNOSIS — O0990 Supervision of high risk pregnancy, unspecified, unspecified trimester: Secondary | ICD-10-CM | POA: Insufficient documentation

## 2020-01-25 DIAGNOSIS — B009 Herpesviral infection, unspecified: Secondary | ICD-10-CM

## 2020-01-25 LAB — POCT URINALYSIS DIPSTICK OB
Blood, UA: NEGATIVE
Glucose, UA: NEGATIVE
Ketones, UA: NEGATIVE
Leukocytes, UA: NEGATIVE
Nitrite, UA: NEGATIVE
POC,PROTEIN,UA: NEGATIVE

## 2020-01-25 MED ORDER — ONDANSETRON 4 MG PO TBDP: 4.0000 mg | ORAL_TABLET | Freq: Three times a day (TID) | ORAL | 1 refills | Status: DC | PRN

## 2020-01-25 MED ORDER — BLOOD PRESSURE MONITOR MISC: 0 refills | Status: DC

## 2020-01-25 MED ORDER — ASPIRIN 81 MG PO CHEW: 162.0000 mg | CHEWABLE_TABLET | Freq: Every day | ORAL | 8 refills | Status: DC

## 2020-01-25 NOTE — Progress Notes (Deleted)
PELVIC US TA/TV:heterogeneous anteverted uterus with mult fibroids,(#1) posterior subserosal fibroid 3.9 x 2.8 x 3.2 cm,(#2) left intramural fibroid 3.4 x 2.9 x 2.6 cm, heterogeneous endometrium (on tamoxifen), EEC 22 mm,normal right ovary,two simple left ovarian cysts 3.8 x 3.7 x 4.1 cm,(#2) 2.4 x 2.8 x 2.6 cm,small amount of simple fluid left adnexa,no pain during ultrasound,ovaries appear mobile

## 2020-01-25 NOTE — Progress Notes (Signed)
INITIAL OBSTETRICAL VISIT Patient name: Tiffany Dyer MRN [id number]  Date of birth: [birth year] Chief Complaint:   Initial Prenatal Visit (nausea)  History of Present Illness:   Tiffany Dyer is a 34 y.o. G45P1011 African American female at [redacted]w[redacted]d by LMP c/w 7wk scan with an Estimated Date of Delivery: 08/04/20 being seen today for her initial obstetrical visit.   Her obstetrical history is significant for advanced maternal age, pregnancy induced hypertension and GDMA2.   Today she reports nausea.  Patient's last menstrual period was 10/29/2019. Last pap May 2018. Results were: normal Review of Systems:   Pertinent items are noted in HPI Denies cramping/contractions, leakage of fluid, vaginal bleeding, abnormal vaginal discharge w/ itching/odor/irritation, headaches, visual changes, shortness of breath, chest pain, abdominal pain, severe nausea/vomiting, or problems with urination or bowel movements unless otherwise stated above.  Pertinent History Reviewed:  Reviewed past medical,surgical, social, obstetrical and family history.  Reviewed problem list, medications and allergies. OB History  Gravida Para Term Preterm AB Living  3 1 1   1 1   SAB TAB Ectopic Multiple Live Births        0 1    # Outcome Date GA Lbr Len/2nd Weight Sex Delivery Anes PTL Lv  3 Current           2 Term 10/10/17 [redacted]w[redacted]d 25:30 / 00:34 7 lb 15.3 oz (3.609 kg) M Vag-Spont EPI N LIV     Birth Comments: WNL  1 AB 2008           Physical Assessment:   Vitals:   01/25/20 1449  BP: 120/75  Pulse: 87  Weight: 232 lb (105.2 kg)  Body mass index is 41.1 kg/m.       Physical Examination:  General appearance - well appearing, and in no distress  Mental status - alert, oriented to person, place, and time  Psych:  She has a normal mood and affect  Skin - warm and dry, normal color, no suspicious lesions noted  Chest - effort normal, all lung fields clear to auscultation bilaterally  Heart - normal rate  and regular rhythm  Abdomen - soft, nontender  Extremities:  No swelling or varicosities noted  Pelvic - not indicated  Thin prep pap is not done  TODAY'S NT 01/27/20 12+4 wks,measurements c/w dates,crl 67.52 mm,fhr 155 bpm,anterior placenta gr 0,normal ovaries,NB present,NT 1.5 mm  Results for orders placed or performed in visit on 01/25/20 (from the past 24 hour(s))  POC Urinalysis Dipstick OB   Collection Time: 01/25/20  3:07 PM  Result Value Ref Range   Color, UA     Clarity, UA     Glucose, UA Negative Negative   Bilirubin, UA     Ketones, UA neg    Spec Grav, UA     Blood, UA neg    pH, UA     POC,PROTEIN,UA Negative Negative, Trace, Small (1+), Moderate (2+), Large (3+), 4+   Urobilinogen, UA     Nitrite, UA neg    Leukocytes, UA Negative Negative   Appearance     Odor      Assessment & Plan:  1) Low-Risk Pregnancy G3P1011 at [redacted]w[redacted]d with an Estimated Date of Delivery: 08/04/20   2) Initial OB visit  3) Hx GDM, early Hgb A1c  4) Hx gHTN, baseline CMP & urine P/C ratio, BASA 162mg  daily  5) Hx HSV, recommend suppression @ 34wks  6) AMA, will be age 69 at delivery  Meds:  Meds ordered  this encounter  Medications  . Blood Pressure Monitor MISC    Sig: For regular home bp monitoring during pregnancy    Dispense:  1 each    Refill:  0    Needs large cuff Z34.80  . aspirin 81 MG chewable tablet    Sig: Chew 2 tablets (162 mg total) by mouth daily.    Dispense:  60 tablet    Refill:  8    Order Specific Question:   Supervising Provider    Answer:   Jonnie Kind [[id number]]  . ondansetron (ZOFRAN ODT) 4 MG disintegrating tablet    Sig: Take 1 tablet (4 mg total) by mouth every 8 (eight) hours as needed for nausea or vomiting.    Dispense:  30 tablet    Refill:  1    Order Specific Question:   Supervising Provider    Answer:   Jonnie Kind [phone]    Initial labs obtained Continue prenatal vitamins Reviewed n/v relief measures and warning s/s to  report Reviewed recommended weight gain based on pre-gravid BMI Encouraged well-balanced diet Genetic Screening discussed: requested Cystic fibrosis, SMA, Fragile X screening discussed declined Ultrasound discussed; fetal survey: requested CCNC completed>PCM not here, form faxed The nature of [place] for [employer] with multiple MDs and other Advanced Practice Providers was explained to patient; also emphasized that fellows, residents, and students are part of our team. Order for home bp cuff given. Check bp weekly, let us know if >140/90.   Indications for ASA therapy (per uptodate)  OR Two or more of the following: Obesity (body mass index >30 kg/m2) Yes Age ?35 years Yes  Indications for early 1 hour GTT (per uptodate)  BMI >=25 (>=23 in Asian women) AND one of the following GDM in a previous pregnancy Yes   Follow-up: Return in about 6 weeks (around 03/07/2020) for 2nd IT, [place]: Anatomy, LROB, in person.   Orders Placed This Encounter  Procedures  . Urine Culture  . GC/Chlamydia Probe Amp  . US OB Comp + 14 Wk  . Obstetric Panel, Including HIV  . Hepatitis C antibody  . Hgb Fractionation Cascade  . Pain Management Screening Profile (10S)  . Hemoglobin A1c  . Comprehensive metabolic panel  . Protein / creatinine ratio, urine  . Integrated 1  . POC Urinalysis Dipstick OB    Myrtis Ser [facility] 01/25/2020 4:41 PM

## 2020-01-25 NOTE — Progress Notes (Addendum)
[place] 12+4 wks,measurements c/w dates,crl 67.52 mm,fhr 155 bpm,anterior placenta gr 0,normal ovaries,NB present,NT 1.5 mm

## 2020-01-25 NOTE — Patient Instructions (Signed)
Shavonn Jones-Garrett, I greatly value your feedback.  If you receive a survey following your visit with [place] today, we appreciate you taking the time to fill it out.  Thanks, Philipp Deputy, CNM  [facility] HAS MOVED!!! It is now [facility] at [facility] ([address] [place], [place] [address]) Entrance located off of [address] Free 24/7 valet parking   Nausea & Vomiting  Have saltine crackers or pretzels by your bed and eat a few bites before you raise your head out of bed in the morning  Eat small frequent meals throughout the day instead of large meals  Drink plenty of fluids throughout the day to stay hydrated, just don't drink a lot of fluids with your meals.  This can make your stomach fill up faster making you feel sick  Do not brush your teeth right after you eat  Products with real ginger are good for nausea, like ginger ale and ginger hard candy Make sure it says made with real ginger!  Sucking on sour candy like lemon heads is also good for nausea  If your prenatal vitamins make you nauseated, take them at night so you will sleep through the nausea  Sea Bands  If you feel like you need medicine for the nausea & vomiting please let us know  If you are unable to keep any fluids or food down please let us know   Constipation  Drink plenty of fluid, preferably water, throughout the day  Eat foods high in fiber such as fruits, vegetables, and grains  Exercise, such as walking, is a good way to keep your bowels regular  Drink warm fluids, especially warm prune juice, or decaf coffee  Eat a 1/2 cup of real oatmeal (not instant), 1/2 cup applesauce, and 1/2-1 cup warm prune juice every day  If needed, you may take Colace (docusate sodium) stool softener once or twice a day to help keep the stool soft.   If you still are having problems with constipation, you may take Miralax once daily as needed to help keep your bowels regular.   Home Blood  Pressure Monitoring for Patients   Your provider has recommended that you check your blood pressure (BP) at least once a week at home. If you do not have a blood pressure cuff at home, one will be provided for you. Contact your provider if you have not received your monitor within 1 week.   Helpful Tips for Accurate Home Blood Pressure Checks  . Don't smoke, exercise, or drink caffeine 30 minutes before checking your BP . Use the restroom before checking your BP (a full bladder can raise your pressure) . Relax in a comfortable upright chair . Feet on the ground . Left arm resting comfortably on a flat surface at the level of your heart . Legs uncrossed . Back supported . Sit quietly and don't talk . Place the cuff on your bare arm . Adjust snuggly, so that only two fingertips can fit between your skin and the top of the cuff . Check 2 readings separated by at least one minute . Keep a log of your BP readings . For a visual, please reference this diagram: http://ccnc.care/bpdiagram  Provider Name: [facility]     Phone: [phone]  Zone 1: ALL CLEAR  Continue to monitor your symptoms:  . BP reading is less than 140 (top number) or less than 90 (bottom number)  . No right upper stomach pain .  No headaches or seeing spots . No feeling nauseated or throwing up . No swelling in face and hands  Zone 2: CAUTION Call your doctor's office for any of the following:  . BP reading is greater than 140 (top number) or greater than 90 (bottom number)  . Stomach pain under your ribs in the middle or right side . Headaches or seeing spots . Feeling nauseated or throwing up . Swelling in face and hands  Zone 3: EMERGENCY  Seek immediate medical care if you have any of the following:  . BP reading is greater than160 (top number) or greater than 110 (bottom number) . Severe headaches not improving with Tylenol . Serious difficulty catching your breath . Any worsening symptoms from Zone  2    First Trimester of Pregnancy The first trimester of pregnancy is from week 1 until the end of week 12 (months 1 through 3). A week after a sperm fertilizes an egg, the egg will implant on the wall of the uterus. This embryo will begin to develop into a baby. Genes from you and your partner are forming the baby. The female genes determine whether the baby is a boy or a girl. At 6-8 weeks, the eyes and face are formed, and the heartbeat can be seen on ultrasound. At the end of 12 weeks, all the baby's organs are formed.  Now that you are pregnant, you will want to do everything you can to have a healthy baby. Two of the most important things are to get good prenatal care and to follow your health care provider's instructions. Prenatal care is all the medical care you receive before the baby's birth. This care will help prevent, find, and treat any problems during the pregnancy and childbirth. BODY CHANGES Your body goes through many changes during pregnancy. The changes vary from woman to woman.   You may gain or lose a couple of pounds at first.  You may feel sick to your stomach (nauseous) and throw up (vomit). If the vomiting is uncontrollable, call your health care provider.  You may tire easily.  You may develop headaches that can be relieved by medicines approved by your health care provider.  You may urinate more often. Painful urination may mean you have a bladder infection.  You may develop heartburn as a result of your pregnancy.  You may develop constipation because certain hormones are causing the muscles that push waste through your intestines to slow down.  You may develop hemorrhoids or swollen, bulging veins (varicose veins).  Your breasts may begin to grow larger and become tender. Your nipples may stick out more, and the tissue that surrounds them (areola) may become darker.  Your gums may bleed and may be sensitive to brushing and flossing.  Dark spots or blotches  (chloasma, mask of pregnancy) may develop on your face. This will likely fade after the baby is born.  Your menstrual periods will stop.  You may have a loss of appetite.  You may develop cravings for certain kinds of food.  You may have changes in your emotions from day to day, such as being excited to be pregnant or being concerned that something may go wrong with the pregnancy and baby.  You may have more vivid and strange dreams.  You may have changes in your hair. These can include thickening of your hair, rapid growth, and changes in texture. Some women also have hair loss during or after pregnancy, or hair that feels dry  or thin. Your hair will most likely return to normal after your baby is born. WHAT TO EXPECT AT YOUR PRENATAL VISITS During a routine prenatal visit:  You will be weighed to make sure you and the baby are growing normally.  Your blood pressure will be taken.  Your abdomen will be measured to track your baby's growth.  The fetal heartbeat will be listened to starting around week 10 or 12 of your pregnancy.  Test results from any previous visits will be discussed. Your health care provider may ask you:  How you are feeling.  If you are feeling the baby move.  If you have had any abnormal symptoms, such as leaking fluid, bleeding, severe headaches, or abdominal cramping.  If you have any questions. Other tests that may be performed during your first trimester include:  Blood tests to find your blood type and to check for the presence of any previous infections. They will also be used to check for low iron levels (anemia) and Rh antibodies. Later in the pregnancy, blood tests for diabetes will be done along with other tests if problems develop.  Urine tests to check for infections, diabetes, or protein in the urine.  An ultrasound to confirm the proper growth and development of the baby.  An amniocentesis to check for possible genetic problems.  Fetal  screens for spina bifida and Down syndrome.  You may need other tests to make sure you and the baby are doing well. HOME CARE INSTRUCTIONS  Medicines  Follow your health care provider's instructions regarding medicine use. Specific medicines may be either safe or unsafe to take during pregnancy.  Take your prenatal vitamins as directed.  If you develop constipation, try taking a stool softener if your health care provider approves. Diet  Eat regular, well-balanced meals. Choose a variety of foods, such as meat or vegetable-based protein, fish, milk and low-fat dairy products, vegetables, fruits, and whole grain breads and cereals. Your health care provider will help you determine the amount of weight gain that is right for you.  Avoid raw meat and uncooked cheese. These carry germs that can cause birth defects in the baby.  Eating four or five small meals rather than three large meals a day may help relieve nausea and vomiting. If you start to feel nauseous, eating a few soda crackers can be helpful. Drinking liquids between meals instead of during meals also seems to help nausea and vomiting.  If you develop constipation, eat more high-fiber foods, such as fresh vegetables or fruit and whole grains. Drink enough fluids to keep your urine clear or pale yellow. Activity and Exercise  Exercise only as directed by your health care provider. Exercising will help you:  Control your weight.  Stay in shape.  Be prepared for labor and delivery.  Experiencing pain or cramping in the lower abdomen or low back is a good sign that you should stop exercising. Check with your health care provider before continuing normal exercises.  Try to avoid standing for long periods of time. Move your legs often if you must stand in one place for a long time.  Avoid heavy lifting.  Wear low-heeled shoes, and practice good posture.  You may continue to have sex unless your health care provider directs you  otherwise. Relief of Pain or Discomfort  Wear a good support bra for breast tenderness.    Take warm sitz baths to soothe any pain or discomfort caused by hemorrhoids. Use hemorrhoid cream if your  health care provider approves.    Rest with your legs elevated if you have leg cramps or low back pain.  If you develop varicose veins in your legs, wear support hose. Elevate your feet for 15 minutes, 3-4 times a day. Limit salt in your diet. Prenatal Care  Schedule your prenatal visits by the twelfth week of pregnancy. They are usually scheduled monthly at first, then more often in the last 2 months before delivery.  Write down your questions. Take them to your prenatal visits.  Keep all your prenatal visits as directed by your health care provider. Safety  Wear your seat belt at all times when driving.  Make a list of emergency phone numbers, including numbers for family, friends, the hospital, and police and fire departments. General Tips  Ask your health care provider for a referral to a local prenatal education class. Begin classes no later than at the beginning of month 6 of your pregnancy.  Ask for help if you have counseling or nutritional needs during pregnancy. Your health care provider can offer advice or refer you to specialists for help with various needs.  Do not use hot tubs, steam rooms, or saunas.  Do not douche or use tampons or scented sanitary pads.  Do not cross your legs for long periods of time.  Avoid cat litter boxes and soil used by cats. These carry germs that can cause birth defects in the baby and possibly loss of the fetus by miscarriage or stillbirth.  Avoid all smoking, herbs, alcohol, and medicines not prescribed by your health care provider. Chemicals in these affect the formation and growth of the baby.  Schedule a dentist appointment. At home, brush your teeth with a soft toothbrush and be gentle when you floss. SEEK MEDICAL CARE IF:   You have  dizziness.  You have mild pelvic cramps, pelvic pressure, or nagging pain in the abdominal area.  You have persistent nausea, vomiting, or diarrhea.  You have a bad smelling vaginal discharge.  You have pain with urination.  You notice increased swelling in your face, hands, legs, or ankles. SEEK IMMEDIATE MEDICAL CARE IF:   You have a fever.  You are leaking fluid from your vagina.  You have spotting or bleeding from your vagina.  You have severe abdominal cramping or pain.  You have rapid weight gain or loss.  You vomit blood or material that looks like coffee grounds.  You are exposed to [place] measles and have never had them.  You are exposed to fifth disease or chickenpox.  You develop a severe headache.  You have shortness of breath.  You have any kind of trauma, such as from a fall or a car accident. Document Released: 10/14/2001 Document Revised: 03/06/2014 Document Reviewed: 08/30/2013 [facility] Patient Information 2015 [place], [place]. This information is not intended to replace advice given to you by your health care provider. Make sure you discuss any questions you have with your health care provider.  Coronavirus (COVID-19) Are you at risk?  Are you at risk for the Coronavirus (COVID-19)?  To be considered HIGH RISK for Coronavirus (COVID-19), you have to meet the following criteria:  . Traveled to [place], [place], [place], [place] or [place]; or in the [place] to [place], [place], [place], or [place]; and have fever, cough, and shortness of breath within the last 2 weeks of travel OR . Been in close contact with a person diagnosed with COVID-19 within the last 2 weeks and  have fever, cough, and shortness of breath . IF YOU DO NOT MEET THESE CRITERIA, YOU ARE CONSIDERED LOW RISK FOR COVID-19.  What to do if you are HIGH RISK for COVID-19?  Marland Kitchen If you are having a medical emergency, call 911. . Seek medical care right away. Before you go to a  doctor's office, urgent care or emergency department, call ahead and tell them about your recent travel, contact with someone diagnosed with COVID-19, and your symptoms. You should receive instructions from your physician's office regarding next steps of care.  . When you arrive at healthcare provider, tell the healthcare staff immediately you have returned from visiting [place], [place], [place], [place] or [place]; or traveled in the [place] to [place], [place], [place], or [place]; in the last two weeks or you have been in close contact with a person diagnosed with COVID-19 in the last 2 weeks.   . Tell the health care staff about your symptoms: fever, cough and shortness of breath. . After you have been seen by a medical provider, you will be either: o Tested for (COVID-19) and discharged home on quarantine except to seek medical care if symptoms worsen, and asked to  - Stay home and avoid contact with others until you get your results (4-5 days)  - Avoid travel on public transportation if possible (such as bus, train, or airplane) or o Sent to the Emergency Department by EMS for evaluation, COVID-19 testing, and possible admission depending on your condition and test results.  What to do if you are LOW RISK for COVID-19?  Reduce your risk of any infection by using the same precautions used for avoiding the common cold or flu:  Marland Kitchen Wash your hands often with soap and warm water for at least 20 seconds.  If soap and water are not readily available, use an alcohol-based hand sanitizer with at least 60% alcohol.  . If coughing or sneezing, cover your mouth and nose by coughing or sneezing into the elbow areas of your shirt or coat, into a tissue or into your sleeve (not your hands). . Avoid shaking hands with others and consider head nods or verbal greetings only. . Avoid touching your eyes, nose, or mouth with unwashed hands.  . Avoid close contact with people who are sick. . Avoid  places or events with large numbers of people in one location, like concerts or sporting events. . Carefully consider travel plans you have or are making. . If you are planning any travel outside or inside the [place], visit the CDC's Travelers' Health webpage for the latest health notices. . If you have some symptoms but not all symptoms, continue to monitor at home and seek medical attention if your symptoms worsen. . If you are having a medical emergency, call 911.   [place] / e-Visit: [email or web address]         [facility]: [facility]: [id number]                   [facility]: [phone]

## 2020-01-26 LAB — COMPREHENSIVE METABOLIC PANEL
ALT: 17 IU/L (ref 0–32)
AST: 18 IU/L (ref 0–40)
Albumin/Globulin Ratio: 1.4 (ref 1.2–2.2)
Albumin: 4 g/dL (ref 3.8–4.8)
Alkaline Phosphatase: 57 IU/L (ref 39–117)
BUN/Creatinine Ratio: 13 (ref 9–23)
BUN: 10 mg/dL (ref 6–20)
Bilirubin Total: 0.3 mg/dL (ref 0.0–1.2)
CO2: 19 mmol/L — ABNORMAL LOW (ref 20–29)
Calcium: 9.2 mg/dL (ref 8.7–10.2)
Chloride: 100 mmol/L (ref 96–106)
Creatinine, Ser: 0.76 mg/dL (ref 0.57–1.00)
GFR calc Af Amer: 118 mL/min/{1.73_m2} (ref >59)
GFR calc non Af Amer: 103 mL/min/{1.73_m2} (ref >59)
Globulin, Total: 2.8 g/dL (ref 1.5–4.5)
Glucose: 131 mg/dL — ABNORMAL HIGH (ref 65–99)
Potassium: 3.9 mmol/L (ref 3.5–5.2)
Sodium: 133 mmol/L — ABNORMAL LOW (ref 134–144)
Total Protein: 6.8 g/dL (ref 6.0–8.5)

## 2020-01-26 LAB — HEMOGLOBIN A1C
Est. average glucose Bld gHb Est-mCnc: 105 mg/dL
Hgb A1c MFr Bld: 5.3 % (ref 4.8–5.6)

## 2020-01-26 LAB — PROTEIN / CREATININE RATIO, URINE
Creatinine, Urine: 87 mg/dL
Protein, Ur: 6.8 mg/dL
Protein/Creat Ratio: 78 mg/g creat (ref 0–200)

## 2020-01-27 LAB — OBSTETRIC PANEL, INCLUDING HIV
Antibody Screen: NEGATIVE
Basophils Absolute: 0 10*3/uL (ref 0.0–0.2)
Basos: 0 %
EOS (ABSOLUTE): 0 10*3/uL (ref 0.0–0.4)
Eos: 0 %
HIV Screen 4th Generation wRfx: NONREACTIVE
Hematocrit: 36.1 % (ref 34.0–46.6)
Hemoglobin: 12.4 g/dL (ref 11.1–15.9)
Hepatitis B Surface Ag: NEGATIVE
Immature Grans (Abs): 0 10*3/uL (ref 0.0–0.1)
Immature Granulocytes: 0 %
Lymphocytes Absolute: 3 10*3/uL (ref 0.7–3.1)
Lymphs: 30 %
MCH: 33.7 pg — ABNORMAL HIGH (ref 26.6–33.0)
MCHC: 34.3 g/dL (ref 31.5–35.7)
MCV: 98 fL — ABNORMAL HIGH (ref 79–97)
Monocytes Absolute: 0.5 10*3/uL (ref 0.1–0.9)
Monocytes: 5 %
Neutrophils Absolute: 6.5 10*3/uL (ref 1.4–7.0)
Neutrophils: 65 %
Platelets: 297 10*3/uL (ref 150–450)
RBC: 3.68 x10E6/uL — ABNORMAL LOW (ref 3.77–5.28)
RDW: 13.3 % (ref 11.7–15.4)
RPR Ser Ql: NONREACTIVE
Rh Factor: POSITIVE
Rubella Antibodies, IGG: 1.74 index (ref >0.99)
WBC: 10.1 10*3/uL (ref 3.4–10.8)

## 2020-01-27 LAB — INTEGRATED 1
Crown Rump Length: 67.5 mm
Gest. Age on Collection Date: 12.9 weeks
Maternal Age at EDD: 35.4 yr
Nuchal Translucency (NT): 1.5 mm
Number of Fetuses: 1
PAPP-A Value: 494 ng/mL
Weight: 232 [lb_av]

## 2020-01-27 LAB — HGB FRACTIONATION CASCADE
Hgb A2: 2.8 % (ref 1.8–3.2)
Hgb A: 97.2 % (ref 96.4–98.8)
Hgb F: 0 % (ref 0.0–2.0)
Hgb S: 0 %

## 2020-01-27 LAB — GC/CHLAMYDIA PROBE AMP
Chlamydia trachomatis, NAA: NEGATIVE
Neisseria Gonorrhoeae by PCR: NEGATIVE

## 2020-01-27 LAB — HEPATITIS C ANTIBODY: Hep C Virus Ab: <0.1 s/co ratio (ref 0.0–0.9)

## 2020-03-07 ENCOUNTER — Other Ambulatory Visit: Payer: Self-pay | Admitting: Advanced Practice Midwife

## 2020-03-07 DIAGNOSIS — Z363 Encounter for antenatal screening for malformations: Secondary | ICD-10-CM

## 2020-03-07 DIAGNOSIS — Z3480 Encounter for supervision of other normal pregnancy, unspecified trimester: Secondary | ICD-10-CM

## 2020-03-08 ENCOUNTER — Ambulatory Visit (INDEPENDENT_AMBULATORY_CARE_PROVIDER_SITE_OTHER): Payer: Managed Care, Other (non HMO)

## 2020-03-08 ENCOUNTER — Other Ambulatory Visit: Payer: Self-pay

## 2020-03-08 ENCOUNTER — Encounter: Payer: Self-pay | Admitting: Obstetrics & Gynecology

## 2020-03-08 ENCOUNTER — Ambulatory Visit (INDEPENDENT_AMBULATORY_CARE_PROVIDER_SITE_OTHER): Payer: Managed Care, Other (non HMO) | Admitting: Obstetrics & Gynecology

## 2020-03-08 VITALS — BP 134/83 | HR 95 | Wt 242.0 lb

## 2020-03-08 DIAGNOSIS — O09522 Supervision of elderly multigravida, second trimester: Secondary | ICD-10-CM

## 2020-03-08 DIAGNOSIS — Z1389 Encounter for screening for other disorder: Secondary | ICD-10-CM

## 2020-03-08 DIAGNOSIS — Z3480 Encounter for supervision of other normal pregnancy, unspecified trimester: Secondary | ICD-10-CM

## 2020-03-08 DIAGNOSIS — Z3A18 18 weeks gestation of pregnancy: Secondary | ICD-10-CM | POA: Diagnosis not present

## 2020-03-08 DIAGNOSIS — Z3482 Encounter for supervision of other normal pregnancy, second trimester: Secondary | ICD-10-CM

## 2020-03-08 DIAGNOSIS — Z363 Encounter for antenatal screening for malformations: Secondary | ICD-10-CM | POA: Diagnosis not present

## 2020-03-08 DIAGNOSIS — Z1379 Encounter for other screening for genetic and chromosomal anomalies: Secondary | ICD-10-CM

## 2020-03-08 DIAGNOSIS — Z331 Pregnant state, incidental: Secondary | ICD-10-CM

## 2020-03-08 NOTE — Progress Notes (Signed)
[place] 18+5 wks,cephalic,fhr 144 bpm,cx 4 cm,anterior placenta gr 0,normal ovaries,svp of fluid 5.6 cm,efw 256 g 47%,anatomy complete,no obvious abnormalities

## 2020-03-08 NOTE — Progress Notes (Signed)
   LOW-RISK PREGNANCY VISIT Patient name: Alante Jones-Garrett MRN [id number]  Date of birth: Sep 11, 1986 Chief Complaint:   Routine Prenatal Visit ([place] & 2nd IT today)  History of Present Illness:   Alessandria Jones-Garrett is a 34 y.o. G1P1011 female at [redacted]w[redacted]d with an Estimated Date of Delivery: 08/04/20 being seen today for ongoing management of a low-risk pregnancy.  Depression screen [facility] 2/9 01/25/2020 07/30/2017 03/17/2017 02/23/2017  Decreased Interest 0 0 0 0  Down, Depressed, Hopeless 0 0 0 0  PHQ - 2 Score 0 0 0 0  Altered sleeping 0 - 0 -  Tired, decreased energy 0 - 0 -  Change in appetite 0 - 0 -  Feeling bad or failure about yourself  0 - 0 -  Trouble concentrating 0 - 0 -  Moving slowly or fidgety/restless 0 - 0 -  Suicidal thoughts 0 - 0 -  PHQ-9 Score 0 - 0 -    Today she reports no complaints. Contractions: Not present. Vag. Bleeding: None.  Movement: Present. denies leaking of fluid. Review of Systems:   Pertinent items are noted in HPI Denies abnormal vaginal discharge w/ itching/odor/irritation, headaches, visual changes, shortness of breath, chest pain, abdominal pain, severe nausea/vomiting, or problems with urination or bowel movements unless otherwise stated above. Pertinent History Reviewed:  Reviewed past medical,surgical, social, obstetrical and family history.  Reviewed problem list, medications and allergies. Physical Assessment:   Vitals:   03/08/20 1521  BP: 134/83  Pulse: 95  Weight: 242 lb (109.8 kg)  Body mass index is 42.87 kg/m.        Physical Examination:   General appearance: Well appearing, and in no distress  Mental status: Alert, oriented to person, place, and time  Skin: Warm & dry  Cardiovascular: Normal heart rate noted  Respiratory: Normal respiratory effort, no distress  Abdomen: Soft, gravid, nontender  Pelvic: Cervical exam deferred         Extremities: Edema: None  Fetal Status:     Movement: Present    Chaperone: n/a    No  results found for this or any previous visit (from the past 24 hour(s)).  Assessment & Plan:  1) Low-risk pregnancy G3P1011 at [redacted]w[redacted]d with an Estimated Date of Delivery: 08/04/20      Meds: No orders of the defined types were placed in this encounter.  Labs/procedures today: sonogram   Plan:  Continue routine obstetrical care  Next visit: prefers in person    Reviewed: Preterm labor symptoms and general obstetric precautions including but not limited to vaginal bleeding, contractions, leaking of fluid and fetal movement were reviewed in detail with the patient.  All questions were answered. Has home bp cuff. Rx faxed to . Check bp weekly, let us know if >140/90.   Follow-up: Return in about 4 weeks (around 04/05/2020) for LROB + pap.  Orders Placed This Encounter  Procedures  . Urine Culture  . INTEGRATED 2  . Pain Management Screening Profile (10S)  . POC Urinalysis Dipstick OB   Amaryllis Dyke Jakson Delpilar  03/08/2020 4:02 PM

## 2020-03-09 LAB — PMP SCREEN PROFILE (10S), URINE
Amphetamine Scrn, Ur: NEGATIVE ng/mL
BARBITURATE SCREEN URINE: NEGATIVE ng/mL
BENZODIAZEPINE SCREEN, URINE: NEGATIVE ng/mL
CANNABINOIDS UR QL SCN: NEGATIVE ng/mL
Cocaine (Metab) Scrn, Ur: NEGATIVE ng/mL
Creatinine(Crt), U: 75.9 mg/dL (ref 20.0–300.0)
Methadone Screen, Urine: NEGATIVE ng/mL
OXYCODONE+OXYMORPHONE UR QL SCN: NEGATIVE ng/mL
Opiate Scrn, Ur: NEGATIVE ng/mL
Ph of Urine: 7.1 (ref 4.5–8.9)
Phencyclidine Qn, Ur: NEGATIVE ng/mL
Propoxyphene Scrn, Ur: NEGATIVE ng/mL

## 2020-03-09 LAB — SPECIMEN STATUS REPORT

## 2020-03-10 LAB — INTEGRATED 2
AFP MoM: 1.22
Alpha-Fetoprotein: 43.5 ng/mL
Crown Rump Length: 67.5 mm
DIA MoM: 0.57
DIA Value: 72 pg/mL
Estriol, Unconjugated: 1.41 ng/mL
Gest. Age on Collection Date: 12.9 weeks
Gestational Age: 19 weeks
Maternal Age at EDD: 35.4 yr
Nuchal Translucency (NT): 1.5 mm
Nuchal Translucency MoM: 0.99
Number of Fetuses: 1
PAPP-A MoM: 0.74
PAPP-A Value: 494 ng/mL
Test Results:: NEGATIVE
Weight: 232 [lb_av]
Weight: 242 [lb_av]
hCG MoM: 1.61
hCG Value: 25.7 IU/mL
uE3 MoM: 0.93

## 2020-03-10 LAB — URINE CULTURE

## 2020-03-10 LAB — SPECIMEN STATUS REPORT

## 2020-03-28 ENCOUNTER — Other Ambulatory Visit: Payer: Self-pay | Admitting: Obstetrics & Gynecology

## 2020-03-28 MED ORDER — OMEPRAZOLE 20 MG PO CPDR
20.0000 mg | DELAYED_RELEASE_CAPSULE | Freq: Every day | ORAL | 6 refills | Status: DC
Start: 2020-03-28 — End: 2020-07-30

## 2020-04-05 ENCOUNTER — Ambulatory Visit (INDEPENDENT_AMBULATORY_CARE_PROVIDER_SITE_OTHER): Payer: Managed Care, Other (non HMO) | Admitting: Advanced Practice Midwife

## 2020-04-05 ENCOUNTER — Other Ambulatory Visit (HOSPITAL_COMMUNITY)
Admission: RE | Admit: 2020-04-05 | Discharge: 2020-04-05 | Disposition: A | Discharge status: Home / Self Care | Payer: Managed Care, Other (non HMO) | Source: Ambulatory Visit | Attending: Advanced Practice Midwife | Admitting: Advanced Practice Midwife

## 2020-04-05 VITALS — BP 137/74 | HR 106 | Wt 250.0 lb

## 2020-04-05 DIAGNOSIS — Z01419 Encounter for gynecological examination (general) (routine) without abnormal findings: Secondary | ICD-10-CM | POA: Insufficient documentation

## 2020-04-05 DIAGNOSIS — Z3482 Encounter for supervision of other normal pregnancy, second trimester: Secondary | ICD-10-CM

## 2020-04-05 DIAGNOSIS — Z331 Pregnant state, incidental: Secondary | ICD-10-CM

## 2020-04-05 DIAGNOSIS — Z3A22 22 weeks gestation of pregnancy: Secondary | ICD-10-CM

## 2020-04-05 DIAGNOSIS — Z1389 Encounter for screening for other disorder: Secondary | ICD-10-CM

## 2020-04-05 NOTE — Patient Instructions (Signed)

## 2020-04-05 NOTE — Progress Notes (Signed)
   LOW-RISK PREGNANCY VISIT Patient name: Tiffany Dyer MRN [id number]  Date of birth: 02/01/1986 Chief Complaint:   Routine Prenatal Visit (Pap Smear)  History of Present Illness:   Tiffany Dyer is a 34 y.o. G62P1011 female at [redacted]w[redacted]d with an Estimated Date of Delivery: 08/04/20 being seen today for ongoing management of a low-risk pregnancy.  Today she reports no complaints. Contractions: Not present. Vag. Bleeding: None.  Movement: Present. denies leaking of fluid. Review of Systems:   Pertinent items are noted in HPI Denies abnormal vaginal discharge w/ itching/odor/irritation, headaches, visual changes, shortness of breath, chest pain, abdominal pain, severe nausea/vomiting, or problems with urination or bowel movements unless otherwise stated above. Pertinent History Reviewed:  Reviewed past medical,surgical, social, obstetrical and family history.  Reviewed problem list, medications and allergies. Physical Assessment:   Vitals:   04/05/20 1345  BP: 137/74  Pulse: (!) 106  Weight: 250 lb (113.4 kg)  Body mass index is 44.29 kg/m.        Physical Examination:   General appearance: Well appearing, and in no distress  Mental status: Alert, oriented to person, place, and time  Skin: Warm & dry  Cardiovascular: Normal heart rate noted  Respiratory: Normal respiratory effort, no distress  Abdomen: Soft, gravid, nontender  Pelvic: normal appearing . Pap collected         Extremities: Edema: None  Fetal Status:     Movement: Present    Chaperone: Catie Beeson, SNP    No results found for this or any previous visit (from the past 24 hour(s)).  Assessment & Plan:  1) Low-risk pregnancy G3P1011 at [redacted]w[redacted]d with an Estimated Date of Delivery: 08/04/20     Meds: No orders of the defined types were placed in this encounter.  Labs/procedures today: Pap  Plan:  Continue routine obstetrical care  Next visit: prefers will be in person for GTT    Reviewed: Preterm labor  symptoms and general obstetric precautions including but not limited to vaginal bleeding, contractions, leaking of fluid and fetal movement were reviewed in detail with the patient.  All questions were answered. Going to get a wrist home bp cuff.. Check bp weekly, let us know if >140/90.   Follow-up: Return in about 4 weeks (around 05/03/2020) for PN2/LROB.  Orders Placed This Encounter  Procedures  . POC Urinalysis Dipstick OB   Jacklyn Shell DNP, CNM 04/05/2020 2:19 PM

## 2020-04-09 LAB — CYTOLOGY - PAP
Comment: NEGATIVE
Diagnosis: NEGATIVE
High risk HPV: NEGATIVE

## 2020-05-08 ENCOUNTER — Other Ambulatory Visit: Payer: Managed Care, Other (non HMO)

## 2020-05-08 ENCOUNTER — Ambulatory Visit (INDEPENDENT_AMBULATORY_CARE_PROVIDER_SITE_OTHER): Payer: Managed Care, Other (non HMO) | Admitting: Women's Health

## 2020-05-08 ENCOUNTER — Encounter: Payer: Self-pay | Admitting: Women's Health

## 2020-05-08 ENCOUNTER — Other Ambulatory Visit: Payer: Self-pay

## 2020-05-08 VITALS — BP 138/76 | HR 100

## 2020-05-08 DIAGNOSIS — Z3A27 27 weeks gestation of pregnancy: Secondary | ICD-10-CM

## 2020-05-08 DIAGNOSIS — Z8632 Personal history of gestational diabetes: Secondary | ICD-10-CM

## 2020-05-08 DIAGNOSIS — Z3480 Encounter for supervision of other normal pregnancy, unspecified trimester: Secondary | ICD-10-CM

## 2020-05-08 DIAGNOSIS — Z1389 Encounter for screening for other disorder: Secondary | ICD-10-CM

## 2020-05-08 DIAGNOSIS — O09522 Supervision of elderly multigravida, second trimester: Secondary | ICD-10-CM

## 2020-05-08 DIAGNOSIS — Z131 Encounter for screening for diabetes mellitus: Secondary | ICD-10-CM

## 2020-05-08 DIAGNOSIS — Z8759 Personal history of other complications of pregnancy, childbirth and the puerperium: Secondary | ICD-10-CM

## 2020-05-08 DIAGNOSIS — Z331 Pregnant state, incidental: Secondary | ICD-10-CM

## 2020-05-08 DIAGNOSIS — Z3482 Encounter for supervision of other normal pregnancy, second trimester: Secondary | ICD-10-CM

## 2020-05-08 LAB — POCT URINALYSIS DIPSTICK OB
Blood, UA: NEGATIVE
Glucose, UA: NEGATIVE
Ketones, UA: NEGATIVE
Leukocytes, UA: NEGATIVE
Nitrite, UA: NEGATIVE
POC,PROTEIN,UA: NEGATIVE

## 2020-05-08 NOTE — Progress Notes (Signed)
LOW-RISK PREGNANCY VISIT Patient name: Tiffany Dyer MRN [id number]  Date of birth: [birth year] Chief Complaint:   Routine Prenatal Visit (PN2)  History of Present Illness:   Tiffany Dyer is a 34 y.o. G59P1011 female at [redacted]w[redacted]d with an Estimated Date of Delivery: 08/04/20 being seen today for ongoing management of a low-risk pregnancy.  Depression screen [facility] 2/9 05/08/2020 01/25/2020 07/30/2017 03/17/2017 02/23/2017  Decreased Interest 0 0 0 0 0  Down, Depressed, Hopeless 0 0 0 0 0  PHQ - 2 Score 0 0 0 0 0  Altered sleeping 0 0 - 0 -  Tired, decreased energy 0 0 - 0 -  Change in appetite 0 0 - 0 -  Feeling bad or failure about yourself  0 0 - 0 -  Trouble concentrating 0 0 - 0 -  Moving slowly or fidgety/restless 0 0 - 0 -  Suicidal thoughts 0 0 - 0 -  PHQ-9 Score 0 0 - 0 -  Difficult doing work/chores Not difficult at all - - - -    Today she reports no complaints. Contractions: Not present. Vag. Bleeding: None.  Movement: Present. denies leaking of fluid. Review of Systems:   Pertinent items are noted in HPI Denies abnormal vaginal discharge w/ itching/odor/irritation, headaches, visual changes, shortness of breath, chest pain, abdominal pain, severe nausea/vomiting, or problems with urination or bowel movements unless otherwise stated above. Pertinent History Reviewed:  Reviewed past medical,surgical, social, obstetrical and family history.  Reviewed problem list, medications and allergies. Physical Assessment:   Vitals:   05/08/20 0900  BP: 138/76  Pulse: 100  There is no height or weight on file to calculate BMI.        Physical Examination:   General appearance: Well appearing, and in no distress  Mental status: Alert, oriented to person, place, and time  Skin: Warm & dry  Cardiovascular: Normal heart rate noted  Respiratory: Normal respiratory effort, no distress  Abdomen: Soft, gravid, nontender  Pelvic: Cervical exam deferred         Extremities: Edema:  None  Fetal Status: Fetal Heart Rate (bpm): 149 Fundal Height: 30 cm Movement: Present    Chaperone: n/a    Results for orders placed or performed in visit on 05/08/20 (from the past 24 hour(s))  POC Urinalysis Dipstick OB   Collection Time: 05/08/20  9:08 AM  Result Value Ref Range   Color, UA     Clarity, UA     Glucose, UA Negative Negative   Bilirubin, UA     Ketones, UA neg    Spec Grav, UA     Blood, UA neg    pH, UA     POC,PROTEIN,UA Negative Negative, Trace, Small (1+), Moderate (2+), Large (3+), 4+   Urobilinogen, UA     Nitrite, UA neg    Leukocytes, UA Negative Negative   Appearance     Odor      Assessment & Plan:  1) Low-risk pregnancy G3P1011 at [redacted]w[redacted]d with an Estimated Date of Delivery: 08/04/20   2) H/O GHTN, SBP borderline today, c/w what she has been running. Asymptomatic. Home bp cuff not accurate, states she can go to urgent care near home and they will check it for free. Reviewed pre-e s/s, reasons to seek care. Continue ASA  3) H/O GDM> early A1C normal, doing GTT today   Meds: No orders of the defined types were placed in this encounter.  Labs/procedures today: pn2, wants tdap next visit  Plan:  Continue routine obstetrical care  Next visit: prefers in person    Reviewed: Preterm labor symptoms and general obstetric precautions including but not limited to vaginal bleeding, contractions, leaking of fluid and fetal movement were reviewed in detail with the patient.  All questions were answered.   Follow-up: Return in about 4 weeks (around 06/05/2020) for LROB, CNM, in person.  Orders Placed This Encounter  Procedures  . POC Urinalysis Dipstick OB   Cheral Marker CNM, [facility] 05/08/2020 9:33 AM

## 2020-05-08 NOTE — Patient Instructions (Signed)
Tiffany Dyer, I greatly value your feedback.  If you receive a survey following your visit with [place] today, we appreciate you taking the time to fill it out.  Thanks, [name], CNM, WHNP-BC   [facility] ([address] [place], [place] [address]) Entrance C, located off of [address] valet parking  Go to [email or web address] to register for FREE online childbirth classes   Call the office [phone]) or go to [facility] if:  You begin to have strong, frequent contractions  Your water breaks.  Sometimes it is a big gush of fluid, sometimes it is just a trickle that keeps getting your panties wet or running down your legs  You have vaginal bleeding.  It is normal to have a small amount of spotting if your cervix was checked.   You don't feel your baby moving like normal.  If you don't, get you something to eat and drink and lay down and focus on feeling your baby move.  You should feel at least 10 movements in 2 hours.  If you don't, you should call the office or go to [facility].    Tdap Vaccine  It is recommended that you get the Tdap vaccine during the third trimester of EACH pregnancy to help protect your baby from getting pertussis (whooping cough)  27-36 weeks is the BEST time to do this so that you can pass the protection on to your baby. During pregnancy is better than after pregnancy, but if you are unable to get it during pregnancy it will be offered at the hospital.   You can get this vaccine with [place], at the health department, your family doctor, or some local pharmacies  Everyone who will be around your baby should also be up-to-date on their vaccines before the baby comes. Adults (who are not pregnant) only need 1 dose of Tdap during adulthood.   [place] Pediatricians/Family Doctors:  [facility] [phone]            [facility] [phone]                 [facility]  [phone] (usually not accepting new patients unless you have family there already, you are always welcome to call and ask)       [facility] Department [phone]       [facility] Pediatricians/Family Doctors:   [facility]: [phone]  [facility]: [phone]  [facility]: [phone]  [facility] Doctors:   [facility]: [phone]   [facility]: [phone]  [facility] Doctors:  [facility]: [phone]   Home Blood Pressure Monitoring for Patients   Your provider has recommended that you check your blood pressure (BP) at least once a week at home. If you do not have a blood pressure cuff at home, one will be provided for you. Contact your provider if you have not received your monitor within 1 week.   Helpful Tips for Accurate Home Blood Pressure Checks  . Don't smoke, exercise, or drink caffeine 30 minutes before checking your BP . Use the restroom before checking your BP (a full bladder can raise your pressure) . Relax in a comfortable upright chair . Feet on the ground . Left arm resting comfortably on a flat surface at the level of your heart . Legs uncrossed . Back supported . Sit quietly and don't talk . Place the cuff on your bare  arm . Adjust snuggly, so that only two fingertips can fit between your skin and the top of the cuff . Check 2 readings separated by at least one minute . Keep a log of your BP readings . For a visual, please reference this diagram: http://ccnc.care/bpdiagram  Provider Name: [facility]     Phone: [phone]  Zone 1: ALL CLEAR  Continue to monitor your symptoms:  . BP reading is less than 140 (top number) or less than 90 (bottom number)  . No right upper stomach pain . No headaches or seeing spots . No feeling nauseated or throwing up . No swelling in face and hands  Zone 2: CAUTION Call your  doctor's office for any of the following:  . BP reading is greater than 140 (top number) or greater than 90 (bottom number)  . Stomach pain under your ribs in the middle or right side . Headaches or seeing spots . Feeling nauseated or throwing up . Swelling in face and hands  Zone 3: EMERGENCY  Seek immediate medical care if you have any of the following:  . BP reading is greater than160 (top number) or greater than 110 (bottom number) . Severe headaches not improving with Tylenol . Serious difficulty catching your breath . Any worsening symptoms from Zone 2   Third Trimester of Pregnancy The third trimester is from week 29 through week 42, months 7 through 9. The third trimester is a time when the fetus is growing rapidly. At the end of the ninth month, the fetus is about 20 inches in length and weighs 6-10 pounds.  BODY CHANGES Your body goes through many changes during pregnancy. The changes vary from woman to woman.   Your weight will continue to increase. You can expect to gain 25-35 pounds (11-16 kg) by the end of the pregnancy.  You may begin to get stretch marks on your hips, abdomen, and breasts.  You may urinate more often because the fetus is moving lower into your pelvis and pressing on your bladder.  You may develop or continue to have heartburn as a result of your pregnancy.  You may develop constipation because certain hormones are causing the muscles that push waste through your intestines to slow down.  You may develop hemorrhoids or swollen, bulging veins (varicose veins).  You may have pelvic pain because of the weight gain and pregnancy hormones relaxing your joints between the bones in your pelvis. Backaches may result from overexertion of the muscles supporting your posture.  You may have changes in your hair. These can include thickening of your hair, rapid growth, and changes in texture. Some women also have hair loss during or after pregnancy, or hair that  feels dry or thin. Your hair will most likely return to normal after your baby is born.  Your breasts will continue to grow and be tender. A yellow discharge may leak from your breasts called colostrum.  Your belly button may stick out.  You may feel short of breath because of your expanding uterus.  You may notice the fetus "dropping," or moving lower in your abdomen.  You may have a bloody mucus discharge. This usually occurs a few days to a week before labor begins.  Your cervix becomes thin and soft (effaced) near your due date. WHAT TO EXPECT AT YOUR PRENATAL EXAMS  You will have prenatal exams every 2 weeks until week 36. Then, you will have weekly prenatal exams. During a routine prenatal visit:  You  will be weighed to make sure you and the fetus are growing normally.  Your blood pressure is taken.  Your abdomen will be measured to track your baby's growth.  The fetal heartbeat will be listened to.  Any test results from the previous visit will be discussed.  You may have a cervical check near your due date to see if you have effaced. At around 36 weeks, your caregiver will check your cervix. At the same time, your caregiver will also perform a test on the secretions of the vaginal tissue. This test is to determine if a type of bacteria, Group B streptococcus, is present. Your caregiver will explain this further. Your caregiver may ask you:  What your birth plan is.  How you are feeling.  If you are feeling the baby move.  If you have had any abnormal symptoms, such as leaking fluid, bleeding, severe headaches, or abdominal cramping.  If you have any questions. Other tests or screenings that may be performed during your third trimester include:  Blood tests that check for low iron levels (anemia).  Fetal testing to check the health, activity level, and growth of the fetus. Testing is done if you have certain medical conditions or if there are problems during the  pregnancy. FALSE LABOR You may feel small, irregular contractions that eventually go away. These are called Braxton Hicks contractions, or false labor. Contractions may last for hours, days, or even weeks before true labor sets in. If contractions come at regular intervals, intensify, or become painful, it is best to be seen by your caregiver.  SIGNS OF LABOR   Menstrual-like cramps.  Contractions that are 5 minutes apart or less.  Contractions that start on the top of the uterus and spread down to the lower abdomen and back.  A sense of increased pelvic pressure or back pain.  A watery or bloody mucus discharge that comes from the vagina. If you have any of these signs before the 37th week of pregnancy, call your caregiver right away. You need to go to the hospital to get checked immediately. HOME CARE INSTRUCTIONS   Avoid all smoking, herbs, alcohol, and unprescribed drugs. These chemicals affect the formation and growth of the baby.  Follow your caregiver's instructions regarding medicine use. There are medicines that are either safe or unsafe to take during pregnancy.  Exercise only as directed by your caregiver. Experiencing uterine cramps is a good sign to stop exercising.  Continue to eat regular, healthy meals.  Wear a good support bra for breast tenderness.  Do not use hot tubs, steam rooms, or saunas.  Wear your seat belt at all times when driving.  Avoid raw meat, uncooked cheese, cat litter boxes, and soil used by cats. These carry germs that can cause birth defects in the baby.  Take your prenatal vitamins.  Try taking a stool softener (if your caregiver approves) if you develop constipation. Eat more high-fiber foods, such as fresh vegetables or fruit and whole grains. Drink plenty of fluids to keep your urine clear or pale yellow.  Take warm sitz baths to soothe any pain or discomfort caused by hemorrhoids. Use hemorrhoid cream if your caregiver approves.  If you  develop varicose veins, wear support hose. Elevate your feet for 15 minutes, 3-4 times a day. Limit salt in your diet.  Avoid heavy lifting, wear low heal shoes, and practice good posture.  Rest a lot with your legs elevated if you have leg cramps or low back  pain.  Visit your dentist if you have not gone during your pregnancy. Use a soft toothbrush to brush your teeth and be gentle when you floss.  A sexual relationship may be continued unless your caregiver directs you otherwise.  Do not travel far distances unless it is absolutely necessary and only with the approval of your caregiver.  Take prenatal classes to understand, practice, and ask questions about the labor and delivery.  Make a trial run to the hospital.  Pack your hospital bag.  Prepare the baby's nursery.  Continue to go to all your prenatal visits as directed by your caregiver. SEEK MEDICAL CARE IF:  You are unsure if you are in labor or if your water has broken.  You have dizziness.  You have mild pelvic cramps, pelvic pressure, or nagging pain in your abdominal area.  You have persistent nausea, vomiting, or diarrhea.  You have a bad smelling vaginal discharge.  You have pain with urination. SEEK IMMEDIATE MEDICAL CARE IF:   You have a fever.  You are leaking fluid from your vagina.  You have spotting or bleeding from your vagina.  You have severe abdominal cramping or pain.  You have rapid weight loss or gain.  You have shortness of breath with chest pain.  You notice sudden or extreme swelling of your face, hands, ankles, feet, or legs.  You have not felt your baby move in over an hour.  You have severe headaches that do not go away with medicine.  You have vision changes. Document Released: 10/14/2001 Document Revised: 10/25/2013 Document Reviewed: 12/21/2012 [facility] Patient Information 2015 [place], [place]. This information is not intended to replace advice given to you by your health  care provider. Make sure you discuss any questions you have with your health care provider.

## 2020-05-09 ENCOUNTER — Encounter: Payer: Self-pay | Admitting: Women's Health

## 2020-05-09 DIAGNOSIS — O24419 Gestational diabetes mellitus in pregnancy, unspecified control: Secondary | ICD-10-CM | POA: Insufficient documentation

## 2020-05-09 LAB — CBC
Hematocrit: 34.1 % (ref 34.0–46.6)
Hemoglobin: 11.4 g/dL (ref 11.1–15.9)
MCH: 33.1 pg — ABNORMAL HIGH (ref 26.6–33.0)
MCHC: 33.4 g/dL (ref 31.5–35.7)
MCV: 99 fL — ABNORMAL HIGH (ref 79–97)
Platelets: 262 10*3/uL (ref 150–450)
RBC: 3.44 x10E6/uL — ABNORMAL LOW (ref 3.77–5.28)
RDW: 13.3 % (ref 11.7–15.4)
WBC: 9.3 10*3/uL (ref 3.4–10.8)

## 2020-05-09 LAB — GLUCOSE TOLERANCE, 2 HOURS W/ 1HR
Glucose, 1 hour: 204 mg/dL — ABNORMAL HIGH (ref 65–179)
Glucose, 2 hour: 180 mg/dL — ABNORMAL HIGH (ref 65–152)
Glucose, Fasting: 115 mg/dL — ABNORMAL HIGH (ref 65–91)

## 2020-05-09 LAB — ANTIBODY SCREEN: Antibody Screen: NEGATIVE

## 2020-05-09 LAB — RPR: RPR Ser Ql: NONREACTIVE

## 2020-05-09 LAB — HIV ANTIBODY (ROUTINE TESTING W REFLEX): HIV Screen 4th Generation wRfx: NONREACTIVE

## 2020-05-10 ENCOUNTER — Encounter: Payer: Self-pay | Admitting: *Deleted

## 2020-05-10 ENCOUNTER — Other Ambulatory Visit: Payer: Self-pay | Admitting: *Deleted

## 2020-05-10 DIAGNOSIS — O24410 Gestational diabetes mellitus in pregnancy, diet controlled: Secondary | ICD-10-CM

## 2020-05-10 DIAGNOSIS — O0990 Supervision of high risk pregnancy, unspecified, unspecified trimester: Secondary | ICD-10-CM

## 2020-05-10 MED ORDER — GLUCOSE BLOOD VI STRP
ORAL_STRIP | 12 refills | Status: DC
Start: 2020-05-10 — End: 2020-07-30

## 2020-05-10 MED ORDER — ONETOUCH ULTRASOFT LANCETS MISC: 12 refills | Status: DC

## 2020-05-15 ENCOUNTER — Other Ambulatory Visit: Payer: Self-pay | Admitting: *Deleted

## 2020-05-15 MED ORDER — ONETOUCH DELICA LANCETS 30G MISC: 1.0000 | Freq: Four times a day (QID) | 12 refills | Status: AC

## 2020-05-16 ENCOUNTER — Other Ambulatory Visit: Payer: Self-pay | Admitting: Obstetrics & Gynecology

## 2020-05-16 ENCOUNTER — Telehealth: Payer: Self-pay | Admitting: Obstetrics & Gynecology

## 2020-05-16 MED ORDER — METFORMIN HCL 500 MG PO TABS: 500.0000 mg | ORAL_TABLET | Freq: Two times a day (BID) | ORAL | 3 refills | Status: DC

## 2020-05-16 MED ORDER — GLYBURIDE 2.5 MG PO TABS
2.5000 mg | ORAL_TABLET | Freq: Two times a day (BID) | ORAL | 3 refills | Status: DC
Start: 2020-05-16 — End: 2020-05-30

## 2020-05-30 ENCOUNTER — Other Ambulatory Visit: Payer: Self-pay | Admitting: Obstetrics & Gynecology

## 2020-05-30 MED ORDER — GLYBURIDE 5 MG PO TABS: 5.0000 mg | ORAL_TABLET | Freq: Two times a day (BID) | ORAL | 1 refills | Status: DC

## 2020-06-07 ENCOUNTER — Encounter: Payer: Managed Care, Other (non HMO) | Admitting: Advanced Practice Midwife

## 2020-06-08 ENCOUNTER — Ambulatory Visit (INDEPENDENT_AMBULATORY_CARE_PROVIDER_SITE_OTHER): Payer: Managed Care, Other (non HMO) | Admitting: Obstetrics & Gynecology

## 2020-06-08 ENCOUNTER — Encounter: Payer: Self-pay | Admitting: Obstetrics & Gynecology

## 2020-06-08 VITALS — Wt 253.5 lb

## 2020-06-08 DIAGNOSIS — Z3A31 31 weeks gestation of pregnancy: Secondary | ICD-10-CM | POA: Diagnosis not present

## 2020-06-08 DIAGNOSIS — Z23 Encounter for immunization: Secondary | ICD-10-CM | POA: Diagnosis not present

## 2020-06-08 DIAGNOSIS — Z331 Pregnant state, incidental: Secondary | ICD-10-CM | POA: Diagnosis not present

## 2020-06-08 DIAGNOSIS — Z1389 Encounter for screening for other disorder: Secondary | ICD-10-CM | POA: Diagnosis not present

## 2020-06-08 DIAGNOSIS — O0990 Supervision of high risk pregnancy, unspecified, unspecified trimester: Secondary | ICD-10-CM | POA: Diagnosis not present

## 2020-06-08 LAB — POCT URINALYSIS DIPSTICK OB
Glucose, UA: NEGATIVE
Leukocytes, UA: NEGATIVE
Nitrite, UA: NEGATIVE
POC,PROTEIN,UA: NEGATIVE

## 2020-06-08 MED ORDER — GLYBURIDE 5 MG PO TABS: ORAL_TABLET | ORAL | 2 refills | Status: DC

## 2020-06-08 NOTE — Progress Notes (Signed)
HIGH-RISK PREGNANCY VISIT Patient name: Tiffany Dyer MRN [id number]  Date of birth: 1986/08/08 Chief Complaint:   High Risk Gestation  History of Present Illness:   Tiffany Dyer is a 34 y.o. G76P1011 female at [redacted]w[redacted]d with an Estimated Date of Delivery: 08/04/20 being seen today for ongoing management of a high-risk pregnancy complicated by A2 DM.  Today she reports no complaints.  Depression screen [facility] 2/9 05/08/2020 01/25/2020 07/30/2017 03/17/2017 02/23/2017  Decreased Interest 0 0 0 0 0  Down, Depressed, Hopeless 0 0 0 0 0  PHQ - 2 Score 0 0 0 0 0  Altered sleeping 0 0 - 0 -  Tired, decreased energy 0 0 - 0 -  Change in appetite 0 0 - 0 -  Feeling bad or failure about yourself  0 0 - 0 -  Trouble concentrating 0 0 - 0 -  Moving slowly or fidgety/restless 0 0 - 0 -  Suicidal thoughts 0 0 - 0 -  PHQ-9 Score 0 0 - 0 -  Difficult doing work/chores Not difficult at all - - - -    Contractions: Not present. Vag. Bleeding: None.  Movement: Present. denies leaking of fluid.  Review of Systems:   Pertinent items are noted in HPI Denies abnormal vaginal discharge w/ itching/odor/irritation, headaches, visual changes, shortness of breath, chest pain, abdominal pain, severe nausea/vomiting, or problems with urination or bowel movements unless otherwise stated above. Pertinent History Reviewed:  Reviewed past medical,surgical, social, obstetrical and family history.  Reviewed problem list, medications and allergies. Physical Assessment:   Vitals:   06/08/20 1216  Weight: 253 lb 8 oz (115 kg)  Body mass index is 44.91 kg/m.           Physical Examination:   General appearance: alert, well appearing, and in no distress  Mental status: alert, oriented to person, place, and time  Skin: warm & dry   Extremities: Edema: None    Cardiovascular: normal heart rate noted  Respiratory: normal respiratory effort, no distress  Abdomen: gravid, soft, non-tender  Pelvic: Cervical exam  deferred         Fetal Status: Fetal Heart Rate (bpm): 152 Fundal Height: 34 cm Movement: Present    Fetal Surveillance Testing today:    Chaperone: n/a    Results for orders placed or performed in visit on 06/08/20 (from the past 24 hour(s))  POC Urinalysis Dipstick OB   Collection Time: 06/08/20 12:26 PM  Result Value Ref Range   Color, UA     Clarity, UA     Glucose, UA Negative Negative   Bilirubin, UA     Ketones, UA 3+    Spec Grav, UA     Blood, UA trace    pH, UA     POC,PROTEIN,UA Negative Negative, Trace, Small (1+), Moderate (2+), Large (3+), 4+   Urobilinogen, UA     Nitrite, UA neg    Leukocytes, UA Negative Negative   Appearance     Odor      Assessment & Plan:  1) High-risk pregnancy G3P1011 at [redacted]w[redacted]d with an Estimated Date of Delivery: 08/04/20   2) A2 DM, stable, increase to 5 in evening keep am at 2.5 mg Begin NST next week  3) Borderline BP, Hx of GHTN, ASA 162    Meds:  Meds ordered this encounter  Medications  . glyBURIDE (DIABETA) 5 MG tablet    Sig: 1/2 tablet in am, 1 tablet at supper    Dispense:  60  tablet    Refill:  2    Labs/procedures today:   Treatment Plan:  Begin twice weekly NST next week, EFW 36 weeks  Reviewed: Preterm labor symptoms and general obstetric precautions including but not limited to vaginal bleeding, contractions, leaking of fluid and fetal movement were reviewed in detail with the patient.  All questions were answered.  home bp cuff. Rx faxed to . Check bp weekly, let us know if >140/90.   Follow-up: Return in about 4 days (around 06/12/2020) for NST, Nurse only.  Orders Placed This Encounter  Procedures  . Tdap vaccine greater than or equal to 7yo IM  . POC Urinalysis Dipstick OB   Lazaro Arms CNM, [facility] 06/08/2020 12:49 PM

## 2020-06-12 ENCOUNTER — Ambulatory Visit (INDEPENDENT_AMBULATORY_CARE_PROVIDER_SITE_OTHER): Payer: Managed Care, Other (non HMO) | Admitting: *Deleted

## 2020-06-12 VITALS — BP 124/77 | HR 99 | Wt 253.0 lb

## 2020-06-12 DIAGNOSIS — O09523 Supervision of elderly multigravida, third trimester: Secondary | ICD-10-CM

## 2020-06-12 DIAGNOSIS — Z1389 Encounter for screening for other disorder: Secondary | ICD-10-CM

## 2020-06-12 DIAGNOSIS — O24410 Gestational diabetes mellitus in pregnancy, diet controlled: Secondary | ICD-10-CM

## 2020-06-12 DIAGNOSIS — O288 Other abnormal findings on antenatal screening of mother: Secondary | ICD-10-CM

## 2020-06-12 DIAGNOSIS — Z3A32 32 weeks gestation of pregnancy: Secondary | ICD-10-CM | POA: Diagnosis not present

## 2020-06-12 DIAGNOSIS — O0990 Supervision of high risk pregnancy, unspecified, unspecified trimester: Secondary | ICD-10-CM

## 2020-06-12 DIAGNOSIS — O24415 Gestational diabetes mellitus in pregnancy, controlled by oral hypoglycemic drugs: Secondary | ICD-10-CM | POA: Diagnosis not present

## 2020-06-12 DIAGNOSIS — Z331 Pregnant state, incidental: Secondary | ICD-10-CM

## 2020-06-12 LAB — POCT URINALYSIS DIPSTICK OB
Blood, UA: NEGATIVE
Glucose, UA: NEGATIVE
Leukocytes, UA: NEGATIVE
Nitrite, UA: NEGATIVE

## 2020-06-12 NOTE — Progress Notes (Addendum)
   NURSE VISIT- NST  SUBJECTIVE:  Tiffany Dyer is a 34 y.o. G79P1011 female at [redacted]w[redacted]d, here for a NST for pregnancy complicated by A2DM currently on Metformin.  She reports active fetal movement, contractions: none, vaginal bleeding: none, membranes: intact.   OBJECTIVE:  BP 124/77   Pulse 99   Wt 253 lb (114.8 kg)   LMP 10/29/2019   BMI 44.82 kg/m   Appears well, no apparent distress  Results for orders placed or performed in visit on 06/12/20 (from the past 24 hour(s))  POC Urinalysis Dipstick OB   Collection Time: 06/12/20 11:19 AM  Result Value Ref Range   Color, UA     Clarity, UA     Glucose, UA Negative Negative   Bilirubin, UA     Ketones, UA large    Spec Grav, UA     Blood, UA neg    pH, UA     POC,PROTEIN,UA Trace Negative, Trace, Small (1+), Moderate (2+), Large (3+), 4+   Urobilinogen, UA     Nitrite, UA neg    Leukocytes, UA Negative Negative   Appearance     Odor      NST: FHR baseline 145 bpm, Variability: moderate, Accelerations:present, Decelerations:  Absent= Cat 1/Reactive Toco: none   ASSESSMENT: G3P1011 at [redacted]w[redacted]d with A2DM currently on metformin NST reactive  PLAN: EFM strip reviewed by Dr. Despina Hidden   Recommendations: keep next appointment as scheduled    Jobe Marker  06/12/2020 12:27 PM  Attestation of Attending Supervision of Nursing Visit Encounter: Evaluation and management procedures were performed by the nursing staff under my supervision and collaboration.  I have reviewed the nurse's note and chart, and I agree with the management and plan.  Rockne Coons MD Attending Physician for the Center for [facility] 06/12/2020 2:50 PM

## 2020-06-15 ENCOUNTER — Ambulatory Visit (INDEPENDENT_AMBULATORY_CARE_PROVIDER_SITE_OTHER): Payer: Managed Care, Other (non HMO) | Admitting: Obstetrics & Gynecology

## 2020-06-15 ENCOUNTER — Encounter: Payer: Self-pay | Admitting: Obstetrics & Gynecology

## 2020-06-15 VITALS — BP 128/73 | HR 100 | Wt 254.0 lb

## 2020-06-15 DIAGNOSIS — Z3A32 32 weeks gestation of pregnancy: Secondary | ICD-10-CM

## 2020-06-15 DIAGNOSIS — O09523 Supervision of elderly multigravida, third trimester: Secondary | ICD-10-CM

## 2020-06-15 DIAGNOSIS — Z1389 Encounter for screening for other disorder: Secondary | ICD-10-CM

## 2020-06-15 DIAGNOSIS — O0990 Supervision of high risk pregnancy, unspecified, unspecified trimester: Secondary | ICD-10-CM

## 2020-06-15 DIAGNOSIS — Z331 Pregnant state, incidental: Secondary | ICD-10-CM

## 2020-06-15 LAB — POCT URINALYSIS DIPSTICK OB
Blood, UA: NEGATIVE
Glucose, UA: NEGATIVE
Leukocytes, UA: NEGATIVE
Nitrite, UA: NEGATIVE
POC,PROTEIN,UA: NEGATIVE

## 2020-06-15 NOTE — Progress Notes (Signed)
HIGH-RISK PREGNANCY VISIT Patient name: Tiffany Dyer MRN [id number]  Date of birth: 13-May-1986 Chief Complaint:   Routine Prenatal Visit (NST)  History of Present Illness:   Tiffany Dyer is a 34 y.o. G84P1011 female at [redacted]w[redacted]d with an Estimated Date of Delivery: 08/04/20 being seen today for ongoing management of a high-risk pregnancy complicated by Class A2 DM, Hx of GHTN.  Today she reports no complaints.  Depression screen [facility] 2/9 05/08/2020 01/25/2020 07/30/2017 03/17/2017 02/23/2017  Decreased Interest 0 0 0 0 0  Down, Depressed, Hopeless 0 0 0 0 0  PHQ - 2 Score 0 0 0 0 0  Altered sleeping 0 0 - 0 -  Tired, decreased energy 0 0 - 0 -  Change in appetite 0 0 - 0 -  Feeling bad or failure about yourself  0 0 - 0 -  Trouble concentrating 0 0 - 0 -  Moving slowly or fidgety/restless 0 0 - 0 -  Suicidal thoughts 0 0 - 0 -  PHQ-9 Score 0 0 - 0 -  Difficult doing work/chores Not difficult at all - - - -    Contractions: Not present. Vag. Bleeding: None.  Movement: Present. denies leaking of fluid.  Review of Systems:   Pertinent items are noted in HPI Denies abnormal vaginal discharge w/ itching/odor/irritation, headaches, visual changes, shortness of breath, chest pain, abdominal pain, severe nausea/vomiting, or problems with urination or bowel movements unless otherwise stated above. Pertinent History Reviewed:  Reviewed past medical,surgical, social, obstetrical and family history.  Reviewed problem list, medications and allergies. Physical Assessment:   Vitals:   06/15/20 1121  BP: 128/73  Pulse: 100  Weight: 254 lb (115.2 kg)  Body mass index is 44.99 kg/m.           Physical Examination:   General appearance: alert, well appearing, and in no distress  Mental status: alert, oriented to person, place, and time  Skin: warm & dry   Extremities: Edema: None    Cardiovascular: normal heart rate noted  Respiratory: normal respiratory effort, no distress  Abdomen:  gravid, soft, non-tender  Pelvic: Cervical exam deferred         Fetal Status:     Movement: Present    Fetal Surveillance Testing today: Tiffany Dyer is at [redacted]w[redacted]d Estimated Date of Delivery: 08/04/20  NST being performed due to Class A2 DM  Today the NST is Reactive  Fetal Monitoring:  Baseline: 140 bpm, Variability: Good {> 6 bpm), Accelerations: Reactive and Decelerations: Absent   reactive  The accelerations are >15 bpm and more than 2 in 20 minutes  Final diagnosis:  Reactive NST  Lazaro Arms, MD      Chaperone: n/a    Results for orders placed or performed in visit on 06/15/20 (from the past 24 hour(s))  POC Urinalysis Dipstick OB   Collection Time: 06/15/20 11:22 AM  Result Value Ref Range   Color, UA     Clarity, UA     Glucose, UA Negative Negative   Bilirubin, UA     Ketones, UA moderate    Spec Grav, UA     Blood, UA neg    pH, UA     POC,PROTEIN,UA Negative Negative, Trace, Small (1+), Moderate (2+), Large (3+), 4+   Urobilinogen, UA     Nitrite, UA neg    Leukocytes, UA Negative Negative   Appearance     Odor      Assessment & Plan:  1) High-risk pregnancy  [id number] at [redacted]w[redacted]d with an Estimated Date of Delivery: 08/04/20   2) Class A2 DM, stable  3) Hx of GHTN, stable, 162 ASA daily  Meds: No orders of the defined types were placed in this encounter.   Labs/procedures today: reactive NST  Treatment Plan:  Continue twice weekly surveillance EFW 36-37 weeks  Reviewed: Preterm labor symptoms and general obstetric precautions including but not limited to vaginal bleeding, contractions, leaking of fluid and fetal movement were reviewed in detail with the patient.  All questions were answered. Has home bp cuff. Rx faxed to . Check bp weekly, let us know if >140/90.   Follow-up: No follow-ups on file.  Orders Placed This Encounter  Procedures   POC Urinalysis Dipstick OB   Tiffany Dyer  06/15/2020 12:01 PM

## 2020-06-19 ENCOUNTER — Other Ambulatory Visit: Payer: Self-pay

## 2020-06-19 ENCOUNTER — Ambulatory Visit (INDEPENDENT_AMBULATORY_CARE_PROVIDER_SITE_OTHER): Payer: Managed Care, Other (non HMO) | Admitting: *Deleted

## 2020-06-19 VITALS — BP 122/70 | HR 86 | Wt 254.0 lb

## 2020-06-19 DIAGNOSIS — Z3480 Encounter for supervision of other normal pregnancy, unspecified trimester: Secondary | ICD-10-CM

## 2020-06-19 DIAGNOSIS — Z1389 Encounter for screening for other disorder: Secondary | ICD-10-CM

## 2020-06-19 DIAGNOSIS — O24410 Gestational diabetes mellitus in pregnancy, diet controlled: Secondary | ICD-10-CM | POA: Diagnosis not present

## 2020-06-19 DIAGNOSIS — Z331 Pregnant state, incidental: Secondary | ICD-10-CM | POA: Diagnosis not present

## 2020-06-19 DIAGNOSIS — Z3A33 33 weeks gestation of pregnancy: Secondary | ICD-10-CM | POA: Diagnosis not present

## 2020-06-19 LAB — POCT URINALYSIS DIPSTICK OB
Blood, UA: NEGATIVE
Glucose, UA: NEGATIVE
Leukocytes, UA: NEGATIVE
Nitrite, UA: NEGATIVE
POC,PROTEIN,UA: NEGATIVE

## 2020-06-19 NOTE — Progress Notes (Signed)
° °  NURSE VISIT- NST  SUBJECTIVE:  Tiffany Dyer is a 34 y.o. G73P1011 female at [redacted]w[redacted]d, here for a NST for pregnancy complicated by A2DM currently on Glyburide.  She reports active fetal movement, contractions: none, vaginal bleeding: none, membranes: intact.   OBJECTIVE:  BP 122/70    Pulse 86    Wt 254 lb (115.2 kg)    LMP 10/29/2019    BMI 44.99 kg/m   Appears well, no apparent distress  Results for orders placed or performed in visit on 06/19/20 (from the past 24 hour(s))  POC Urinalysis Dipstick OB   Collection Time: 06/19/20 11:12 AM  Result Value Ref Range   Color, UA     Clarity, UA     Glucose, UA Negative Negative   Bilirubin, UA     Ketones, UA large    Spec Grav, UA     Blood, UA neg    pH, UA     POC,PROTEIN,UA Negative Negative, Trace, Small (1+), Moderate (2+), Large (3+), 4+   Urobilinogen, UA     Nitrite, UA neg    Leukocytes, UA Negative Negative   Appearance     Odor      NST: FHR baseline 135 bpm, Variability: moderate, Accelerations:present, Decelerations:  Absent= Cat 1/Reactive Toco: none   ASSESSMENT: G3P1011 at [redacted]w[redacted]d with A2DM currently on Glyburide NST reactive  PLAN: EFM strip reviewed by Dr. Emelda Fear   Recommendations: keep next appointment as scheduled    Jobe Marker  06/19/2020 12:12 PM

## 2020-06-21 NOTE — Progress Notes (Signed)
Patient ID: Tiffany Dyer, female   DOB: 05-01-1986, 34 y.o.   MRN: [id number]    [facility] PREGNANCY VISIT Patient name: Tiffany Dyer MRN [id number]  Date of birth: [birth year] Chief Complaint:   No chief complaint on file.  History of Present Illness:   Tiffany Dyer is a 34 y.o. G57P1011 female at [redacted]w[redacted]d with an Estimated Date of Delivery: 08/04/20 being seen today for ongoing management of a high-risk pregnancy complicated by A2DM currently on Glyburide.  Today she reports no complaints.  Depression screen [facility] 2/9 05/08/2020 01/25/2020 07/30/2017 03/17/2017 02/23/2017  Decreased Interest 0 0 0 0 0  Down, Depressed, Hopeless 0 0 0 0 0  PHQ - 2 Score 0 0 0 0 0  Altered sleeping 0 0 - 0 -  Tired, decreased energy 0 0 - 0 -  Change in appetite 0 0 - 0 -  Feeling bad or failure about yourself  0 0 - 0 -  Trouble concentrating 0 0 - 0 -  Moving slowly or fidgety/restless 0 0 - 0 -  Suicidal thoughts 0 0 - 0 -  PHQ-9 Score 0 0 - 0 -  Difficult doing work/chores Not difficult at all - - - -     .  .   . denies leaking of fluid.  Review of Systems:   Pertinent items are noted in HPI Denies abnormal vaginal discharge w/ itching/odor/irritation, headaches, visual changes, shortness of breath, chest pain, abdominal pain, severe nausea/vomiting, or problems with urination or bowel movements unless otherwise stated above. Pertinent History Reviewed:  Reviewed past medical,surgical, social, obstetrical and family history.  Reviewed problem list, medications and allergies. Physical Assessment:  There were no vitals filed for this visit.There is no height or weight on file to calculate BMI.           Physical Examination:   General appearance: alert, well appearing, and in no distress, oriented to person, place, and time and overweight  Mental status: alert, oriented to person, place, and time, normal mood, behavior, speech, dress, motor activity, and thought processes  Skin: warm &  dry   Extremities:      Cardiovascular: normal heart rate noted  Respiratory: normal respiratory effort, no distress  Abdomen: gravid, soft, non-tender,abd laxity. FH 38 cm but uterus feels normal size.  Pelvic: Cervical exam deferred         Fetal Status:          Fetal Surveillance Testing today: NST  Chaperone: Irving Burton Tufford  No results found for this or any previous visit (from the past 24 hour(s)).  Assessment & Plan:  1) High-risk pregnancy G3P1011 at [redacted]w[redacted]d with an Estimated Date of Delivery: 08/04/20   2) Class A2 DM, stable on Glyburide  3) Hx of GHTN, stable, 162 ASA daily  Meds: No orders of the defined types were placed in this encounter.  Labs/procedures today: none  Treatment Plan:  Continue twice weekly surveillance EFW 36-37 weeks  Reviewed: Preterm labor symptoms and general obstetric precautions including but not limited to vaginal bleeding, contractions, leaking of fluid and fetal movement were reviewed in detail with the patient.  All questions were answered. Has home bp cuff. Check bp weekly, let us know if >140/90.   Follow-up: No follow-ups on file.  No orders of the defined types were placed in this encounter.  06/21/2020 10:20 AM  By signing my name below, I, Pietro Cassis, attest that this documentation has been prepared under the direction and in the presence  of Tilda Burrow, MD. Electronically Signed: Pietro Cassis, Medical Scribe. 06/21/20. 10:20 AM.  I personally performed the services described in this documentation, which was SCRIBED in my presence. The recorded information has been reviewed and considered accurate. It has been edited as necessary during review. Tilda Burrow, MD

## 2020-06-22 ENCOUNTER — Encounter: Payer: Self-pay | Admitting: Obstetrics and Gynecology

## 2020-06-22 ENCOUNTER — Other Ambulatory Visit: Payer: Self-pay

## 2020-06-22 ENCOUNTER — Ambulatory Visit (INDEPENDENT_AMBULATORY_CARE_PROVIDER_SITE_OTHER): Payer: Managed Care, Other (non HMO) | Admitting: Obstetrics and Gynecology

## 2020-06-22 VITALS — BP 130/85 | HR 100 | Wt 254.6 lb

## 2020-06-22 DIAGNOSIS — Z331 Pregnant state, incidental: Secondary | ICD-10-CM

## 2020-06-22 DIAGNOSIS — O0990 Supervision of high risk pregnancy, unspecified, unspecified trimester: Secondary | ICD-10-CM

## 2020-06-22 DIAGNOSIS — Z1389 Encounter for screening for other disorder: Secondary | ICD-10-CM

## 2020-06-22 DIAGNOSIS — Z3A33 33 weeks gestation of pregnancy: Secondary | ICD-10-CM

## 2020-06-22 DIAGNOSIS — O09523 Supervision of elderly multigravida, third trimester: Secondary | ICD-10-CM

## 2020-06-22 LAB — POCT URINALYSIS DIPSTICK OB
Blood, UA: NEGATIVE
Glucose, UA: NEGATIVE
Nitrite, UA: NEGATIVE
POC,PROTEIN,UA: NEGATIVE

## 2020-06-26 ENCOUNTER — Ambulatory Visit (INDEPENDENT_AMBULATORY_CARE_PROVIDER_SITE_OTHER): Payer: Managed Care, Other (non HMO) | Admitting: *Deleted

## 2020-06-26 VITALS — BP 113/77 | Wt 253.0 lb

## 2020-06-26 DIAGNOSIS — Z331 Pregnant state, incidental: Secondary | ICD-10-CM

## 2020-06-26 DIAGNOSIS — Z1389 Encounter for screening for other disorder: Secondary | ICD-10-CM | POA: Diagnosis not present

## 2020-06-26 DIAGNOSIS — Z3A34 34 weeks gestation of pregnancy: Secondary | ICD-10-CM | POA: Diagnosis not present

## 2020-06-26 DIAGNOSIS — O0990 Supervision of high risk pregnancy, unspecified, unspecified trimester: Secondary | ICD-10-CM

## 2020-06-26 DIAGNOSIS — O24410 Gestational diabetes mellitus in pregnancy, diet controlled: Secondary | ICD-10-CM | POA: Diagnosis not present

## 2020-06-26 DIAGNOSIS — O09523 Supervision of elderly multigravida, third trimester: Secondary | ICD-10-CM

## 2020-06-26 LAB — POCT URINALYSIS DIPSTICK OB
Blood, UA: NEGATIVE
Glucose, UA: NEGATIVE
Leukocytes, UA: NEGATIVE
Nitrite, UA: NEGATIVE

## 2020-06-26 NOTE — Progress Notes (Addendum)
   NURSE VISIT- NST  SUBJECTIVE:  Tiffany Dyer is a 34 y.o. G26P1011 female at [redacted]w[redacted]d, here for a NST for pregnancy complicated by A2DM currently on Glyburide.  She reports active fetal movement, contractions: none, vaginal bleeding: none, membranes: intact.   OBJECTIVE:  BP 113/77   Wt 253 lb (114.8 kg)   LMP 10/29/2019   BMI 44.82 kg/m   Appears well, no apparent distress  Results for orders placed or performed in visit on 06/26/20 (from the past 24 hour(s))  POC Urinalysis Dipstick OB   Collection Time: 06/26/20 11:44 AM  Result Value Ref Range   Color, UA     Clarity, UA     Glucose, UA Negative Negative   Bilirubin, UA     Ketones, UA large    Spec Grav, UA     Blood, UA neg    pH, UA     POC,PROTEIN,UA Trace Negative, Trace, Small (1+), Moderate (2+), Large (3+), 4+   Urobilinogen, UA     Nitrite, UA neg    Leukocytes, UA Negative Negative   Appearance     Odor      NST: FHR baseline 140  bpm, Variability: moderate, Accelerations:present, Decelerations:  Absent= Cat 1/Reactive Toco: none   ASSESSMENT: G3P1011 at [redacted]w[redacted]d with A2DM currently on Glyburide NST reactive  PLAN: EFM strip reviewed by Dr. Despina Hidden   Recommendations: keep next appointment as scheduled    Jobe Marker  06/26/2020 11:45 AM  Attestation of Attending Supervision of Nursing Visit Encounter: Evaluation and management procedures were performed by the nursing staff under my supervision and collaboration.  I have reviewed the nurse's note and chart, and I agree with the management and plan.  Rockne Coons MD Attending Physician for the Center for [facility] 06/26/2020 3:09 PM   Attestation of Attending Supervision of Nursing Visit Encounter: Evaluation and management procedures were performed by the nursing staff under my supervision and collaboration.  I have reviewed the nurse's note and chart, and I agree with the management and plan.  Rockne Coons MD Attending Physician  for the Center for [facility] 06/28/2020 2:21 PM

## 2020-06-28 ENCOUNTER — Telehealth: Payer: Self-pay | Admitting: *Deleted

## 2020-06-28 ENCOUNTER — Telehealth: Payer: Self-pay | Admitting: Obstetrics & Gynecology

## 2020-06-28 NOTE — Telephone Encounter (Signed)
Patient states she had some diarrhea this morning and is having episodes of vomiting throughout the day. No other issues.  Encouraged patient to sip fluids to avoid dehydration and can try to take imodium if she feels like she needs it.  Can also take nausea meds if she has any.  Could be a stomach virus or something she ate but to let us know how she feels in the morning prior to coming to her appt.  PT verbalized understanding .

## 2020-06-28 NOTE — Telephone Encounter (Signed)
Patient started having diarrhea and vomiting off and on all day. And would like to know what she can do to help with her symptoms.

## 2020-06-29 ENCOUNTER — Other Ambulatory Visit: Payer: Managed Care, Other (non HMO) | Admitting: Obstetrics & Gynecology

## 2020-07-03 ENCOUNTER — Ambulatory Visit (INDEPENDENT_AMBULATORY_CARE_PROVIDER_SITE_OTHER): Payer: Managed Care, Other (non HMO) | Admitting: *Deleted

## 2020-07-03 VITALS — BP 129/85 | HR 84 | Wt 255.2 lb

## 2020-07-03 DIAGNOSIS — Z1389 Encounter for screening for other disorder: Secondary | ICD-10-CM | POA: Diagnosis not present

## 2020-07-03 DIAGNOSIS — O24415 Gestational diabetes mellitus in pregnancy, controlled by oral hypoglycemic drugs: Secondary | ICD-10-CM

## 2020-07-03 DIAGNOSIS — O24410 Gestational diabetes mellitus in pregnancy, diet controlled: Secondary | ICD-10-CM

## 2020-07-03 DIAGNOSIS — O0990 Supervision of high risk pregnancy, unspecified, unspecified trimester: Secondary | ICD-10-CM

## 2020-07-03 DIAGNOSIS — Z331 Pregnant state, incidental: Secondary | ICD-10-CM

## 2020-07-03 DIAGNOSIS — O288 Other abnormal findings on antenatal screening of mother: Secondary | ICD-10-CM

## 2020-07-03 DIAGNOSIS — Z3A35 35 weeks gestation of pregnancy: Secondary | ICD-10-CM

## 2020-07-03 DIAGNOSIS — O09523 Supervision of elderly multigravida, third trimester: Secondary | ICD-10-CM

## 2020-07-03 LAB — POCT URINALYSIS DIPSTICK OB
Glucose, UA: NEGATIVE
Leukocytes, UA: NEGATIVE
Nitrite, UA: NEGATIVE
POC,PROTEIN,UA: NEGATIVE

## 2020-07-03 NOTE — Progress Notes (Addendum)
   NURSE VISIT- NST  SUBJECTIVE:  Tiffany Dyer is a 34 y.o. G68P1011 female at [redacted]w[redacted]d, here for a NST for pregnancy complicated by A2DM currently on Glyburide.  She reports active fetal movement, contractions: none, vaginal bleeding: none, membranes: intact.   OBJECTIVE:  BP 129/85   Pulse 84   Wt 255 lb 3.2 oz (115.8 kg)   LMP 10/29/2019   BMI 45.21 kg/m   Appears well, no apparent distress  Results for orders placed or performed in visit on 07/03/20 (from the past 24 hour(s))  POC Urinalysis Dipstick OB   Collection Time: 07/03/20 11:29 AM  Result Value Ref Range   Color, UA     Clarity, UA     Glucose, UA Negative Negative   Bilirubin, UA     Ketones, UA large    Spec Grav, UA     Blood, UA small    pH, UA     POC,PROTEIN,UA Negative Negative, Trace, Small (1+), Moderate (2+), Large (3+), 4+   Urobilinogen, UA     Nitrite, UA neg    Leukocytes, UA Negative Negative   Appearance     Odor      NST: FHR baseline 145 bpm, Variability: moderate, Accelerations:present, Decelerations:  Absent= Cat 1/Reactive Toco: none   ASSESSMENT: G3P1011 at [redacted]w[redacted]d with A2DM currently on Glyburide NST reactive  PLAN: EFM strip reviewed by Dr. Despina Hidden   Recommendations: keep next appointment as scheduled    Jobe Marker  07/03/2020 2:23 PM   Attestation of Attending Supervision of Nursing Visit Encounter: Evaluation and management procedures were performed by the nursing staff under my supervision and collaboration.  I have reviewed the nurse's note and chart, and I agree with the management and plan.  Rockne Coons MD Attending Physician for the Center for [facility] 07/31/2020 4:41 PM

## 2020-07-06 ENCOUNTER — Encounter: Payer: Self-pay | Admitting: Obstetrics & Gynecology

## 2020-07-06 ENCOUNTER — Ambulatory Visit (INDEPENDENT_AMBULATORY_CARE_PROVIDER_SITE_OTHER): Payer: Managed Care, Other (non HMO) | Admitting: Obstetrics & Gynecology

## 2020-07-06 ENCOUNTER — Other Ambulatory Visit: Payer: Self-pay

## 2020-07-06 DIAGNOSIS — O24419 Gestational diabetes mellitus in pregnancy, unspecified control: Secondary | ICD-10-CM | POA: Diagnosis not present

## 2020-07-06 DIAGNOSIS — O0990 Supervision of high risk pregnancy, unspecified, unspecified trimester: Secondary | ICD-10-CM

## 2020-07-06 DIAGNOSIS — Z1389 Encounter for screening for other disorder: Secondary | ICD-10-CM | POA: Diagnosis not present

## 2020-07-06 DIAGNOSIS — O09523 Supervision of elderly multigravida, third trimester: Secondary | ICD-10-CM

## 2020-07-06 DIAGNOSIS — Z3A35 35 weeks gestation of pregnancy: Secondary | ICD-10-CM

## 2020-07-06 DIAGNOSIS — O24410 Gestational diabetes mellitus in pregnancy, diet controlled: Secondary | ICD-10-CM

## 2020-07-06 LAB — POCT URINALYSIS DIPSTICK OB
Glucose, UA: NEGATIVE
Leukocytes, UA: NEGATIVE
Nitrite, UA: NEGATIVE

## 2020-07-06 MED ORDER — ACYCLOVIR 400 MG PO TABS: 400.0000 mg | ORAL_TABLET | Freq: Three times a day (TID) | ORAL | 2 refills | Status: DC

## 2020-07-06 MED ORDER — ACYCLOVIR 400 MG PO TABS
400.0000 mg | ORAL_TABLET | Freq: Three times a day (TID) | ORAL | 2 refills | Status: DC
Start: 2020-07-06 — End: 2020-07-06

## 2020-07-06 NOTE — Progress Notes (Signed)
HIGH-RISK PREGNANCY VISIT Patient name: Tiffany Dyer MRN [id number]  Date of birth: [birth year] Chief Complaint:   Non-stress Test  History of Present Illness:   Tiffany Dyer is a 34 y.o. G4P1011 female at [redacted]w[redacted]d with an Estimated Date of Delivery: 08/04/20 being seen today for ongoing management of a high-risk pregnancy complicated by Class A2 DM.  Today she reports no complaints.  Depression screen [facility] 2/9 05/08/2020 01/25/2020 07/30/2017 03/17/2017 02/23/2017  Decreased Interest 0 0 0 0 0  Down, Depressed, Hopeless 0 0 0 0 0  PHQ - 2 Score 0 0 0 0 0  Altered sleeping 0 0 - 0 -  Tired, decreased energy 0 0 - 0 -  Change in appetite 0 0 - 0 -  Feeling bad or failure about yourself  0 0 - 0 -  Trouble concentrating 0 0 - 0 -  Moving slowly or fidgety/restless 0 0 - 0 -  Suicidal thoughts 0 0 - 0 -  PHQ-9 Score 0 0 - 0 -  Difficult doing work/chores Not difficult at all - - - -     .  .   . denies leaking of fluid.  Review of Systems:   Pertinent items are noted in HPI Denies abnormal vaginal discharge w/ itching/odor/irritation, headaches, visual changes, shortness of breath, chest pain, abdominal pain, severe nausea/vomiting, or problems with urination or bowel movements unless otherwise stated above. Pertinent History Reviewed:  Reviewed past medical,surgical, social, obstetrical and family history.  Reviewed problem list, medications and allergies. Physical Assessment:  There were no vitals filed for this visit.There is no height or weight on file to calculate BMI.           Physical Examination:   General appearance: alert, well appearing, and in no distress  Mental status: alert, oriented to person, place, and time  Skin: warm & dry   Extremities:      Cardiovascular: normal heart rate noted  Respiratory: normal respiratory effort, no distress  Abdomen: gravid, soft, non-tender  Pelvic: Cervical exam deferred         Fetal Status:          Fetal Surveillance  Testing today: reactive NST   Chaperone: n/a    Results for orders placed or performed in visit on 07/06/20 (from the past 24 hour(s))  POC Urinalysis Dipstick OB   Collection Time: 07/06/20 11:40 AM  Result Value Ref Range   Color, UA     Clarity, UA     Glucose, UA Negative Negative   Bilirubin, UA     Ketones, UA small    Spec Grav, UA     Blood, UA trace    pH, UA     POC,PROTEIN,UA Trace Negative, Trace, Small (1+), Moderate (2+), Large (3+), 4+   Urobilinogen, UA     Nitrite, UA neg    Leukocytes, UA Negative Negative   Appearance     Odor      Assessment & Plan:  1) High-risk pregnancy G3P1011 at [redacted]w[redacted]d with an Estimated Date of Delivery: 08/04/20   2) Class A2 DM, stable, try bumping qhs glyburide to 5 mg, some fastings are a little high   Meds: No orders of the defined types were placed in this encounter.   Labs/procedures today: Reactive NST  Tiffany Dyer is at [redacted]w[redacted]d Estimated Date of Delivery: 08/04/20  NST being performed due to Class A2 DM  Today the NST is Reactive  Fetal Monitoring:  Baseline: 135 bpm, Variability:  Good {> 6 bpm), Accelerations: Reactive and Decelerations: Absent   reactive  The accelerations are >15 bpm and more than 2 in 20 minutes  Final diagnosis:  Reactive NST  Tiffany Arms, MD     Treatment Plan:  Twice weekly assessments, EFW nest week IOL 39 weeks  Reviewed: Term labor symptoms and general obstetric precautions including but not limited to vaginal bleeding, contractions, leaking of fluid and fetal movement were reviewed in detail with the patient.  All questions were answered. Has home bp cuff. Rx faxed to . Check bp weekly, let us know if >140/90.   Follow-up: Return in about 4 days (around 07/10/2020) for Needs sonogram next week, either Tuesday or Friday is ok, which ever works, NST the other visit.  Orders Placed This Encounter  Procedures  . POC Urinalysis Dipstick OB   Amaryllis Dyke Szymon Foiles  07/06/2020 12:10 PM

## 2020-07-06 NOTE — Addendum Note (Signed)
Addended by: Lazaro Arms on: 07/06/2020 12:29 PM   Modules accepted: Orders

## 2020-07-10 ENCOUNTER — Ambulatory Visit (INDEPENDENT_AMBULATORY_CARE_PROVIDER_SITE_OTHER): Payer: Managed Care, Other (non HMO) | Admitting: *Deleted

## 2020-07-10 ENCOUNTER — Other Ambulatory Visit: Payer: Self-pay

## 2020-07-10 VITALS — BP 139/94 | HR 92 | Wt 255.0 lb

## 2020-07-10 DIAGNOSIS — O0990 Supervision of high risk pregnancy, unspecified, unspecified trimester: Secondary | ICD-10-CM

## 2020-07-10 DIAGNOSIS — Z1389 Encounter for screening for other disorder: Secondary | ICD-10-CM | POA: Diagnosis not present

## 2020-07-10 DIAGNOSIS — O09523 Supervision of elderly multigravida, third trimester: Secondary | ICD-10-CM

## 2020-07-10 DIAGNOSIS — O24410 Gestational diabetes mellitus in pregnancy, diet controlled: Secondary | ICD-10-CM | POA: Diagnosis not present

## 2020-07-10 DIAGNOSIS — Z331 Pregnant state, incidental: Secondary | ICD-10-CM | POA: Diagnosis not present

## 2020-07-10 LAB — POCT URINALYSIS DIPSTICK OB
Blood, UA: NEGATIVE
Glucose, UA: NEGATIVE
Ketones, UA: NEGATIVE
Nitrite, UA: NEGATIVE

## 2020-07-10 NOTE — Progress Notes (Addendum)
   NURSE VISIT- NST  SUBJECTIVE:  Tiffany Dyer is a 34 y.o. G9P1011 female at [redacted]w[redacted]d, here for a NST for pregnancy complicated by A2DM currently on Glyburide.  She reports active fetal movement, contractions: none, vaginal bleeding: none, membranes: intact.   OBJECTIVE:  BP (!) 139/94   Pulse 92   Wt 255 lb (115.7 kg)   LMP 10/29/2019   BMI 45.17 kg/m   Appears well, no apparent distress  Results for orders placed or performed in visit on 07/10/20 (from the past 24 hour(s))  POC Urinalysis Dipstick OB   Collection Time: 07/10/20 12:20 PM  Result Value Ref Range   Color, UA     Clarity, UA     Glucose, UA Negative Negative   Bilirubin, UA     Ketones, UA neg    Spec Grav, UA     Blood, UA neg    pH, UA     POC,PROTEIN,UA Trace Negative, Trace, Small (1+), Moderate (2+), Large (3+), 4+   Urobilinogen, UA     Nitrite, UA neg    Leukocytes, UA Moderate (2+) (A) Negative   Appearance     Odor      NST: FHR baseline 140 bpm, Variability: moderate, Accelerations:present, Decelerations:  Absent= Cat 1/Reactive Toco: none   ASSESSMENT: G3P1011 at [redacted]w[redacted]d with A2DM currently on Glyburide NST reactive  PLAN: EFM strip reviewed by Dr. Despina Hidden   Recommendations: keep next appointment as scheduled    Jobe Marker  07/10/2020 12:21 PM  Attestation of Attending Supervision of Nursing Visit Encounter: Evaluation and management procedures were performed by the nursing staff under my supervision and collaboration.  I have reviewed the nurse's note and chart, and I agree with the management and plan.  Rockne Coons MD Attending Physician for the Center for [facility] 08/13/2020 2:36 PM

## 2020-07-12 ENCOUNTER — Other Ambulatory Visit: Payer: Self-pay | Admitting: Obstetrics & Gynecology

## 2020-07-12 DIAGNOSIS — O24419 Gestational diabetes mellitus in pregnancy, unspecified control: Secondary | ICD-10-CM

## 2020-07-13 ENCOUNTER — Ambulatory Visit (INDEPENDENT_AMBULATORY_CARE_PROVIDER_SITE_OTHER): Payer: Managed Care, Other (non HMO)

## 2020-07-13 ENCOUNTER — Encounter: Payer: Managed Care, Other (non HMO) | Admitting: Obstetrics & Gynecology

## 2020-07-13 DIAGNOSIS — O09523 Supervision of elderly multigravida, third trimester: Secondary | ICD-10-CM

## 2020-07-13 DIAGNOSIS — O24419 Gestational diabetes mellitus in pregnancy, unspecified control: Secondary | ICD-10-CM | POA: Diagnosis not present

## 2020-07-13 DIAGNOSIS — O0990 Supervision of high risk pregnancy, unspecified, unspecified trimester: Secondary | ICD-10-CM

## 2020-07-13 NOTE — Progress Notes (Signed)
[place] 36+6 wks,cephalic,BPP 8/8, anterior placenta gr 3,afi 7.9 cm,fhr 164 bpm,EFW 2949 g 45%

## 2020-07-17 ENCOUNTER — Other Ambulatory Visit: Payer: Managed Care, Other (non HMO)

## 2020-07-17 ENCOUNTER — Other Ambulatory Visit: Payer: Self-pay

## 2020-07-17 ENCOUNTER — Ambulatory Visit (INDEPENDENT_AMBULATORY_CARE_PROVIDER_SITE_OTHER): Payer: Managed Care, Other (non HMO) | Admitting: Women's Health

## 2020-07-17 ENCOUNTER — Encounter: Payer: Self-pay | Admitting: Women's Health

## 2020-07-17 ENCOUNTER — Other Ambulatory Visit (HOSPITAL_COMMUNITY)
Admission: RE | Admit: 2020-07-17 | Discharge: 2020-07-17 | Disposition: A | Discharge status: Home / Self Care | Payer: Managed Care, Other (non HMO) | Source: Ambulatory Visit | Attending: Obstetrics & Gynecology | Admitting: Obstetrics & Gynecology

## 2020-07-17 VITALS — BP 136/86 | HR 92 | Wt 259.0 lb

## 2020-07-17 DIAGNOSIS — O24415 Gestational diabetes mellitus in pregnancy, controlled by oral hypoglycemic drugs: Secondary | ICD-10-CM | POA: Diagnosis not present

## 2020-07-17 DIAGNOSIS — Z331 Pregnant state, incidental: Secondary | ICD-10-CM

## 2020-07-17 DIAGNOSIS — Z1389 Encounter for screening for other disorder: Secondary | ICD-10-CM

## 2020-07-17 DIAGNOSIS — Z3A37 37 weeks gestation of pregnancy: Secondary | ICD-10-CM | POA: Diagnosis not present

## 2020-07-17 DIAGNOSIS — O0993 Supervision of high risk pregnancy, unspecified, third trimester: Secondary | ICD-10-CM | POA: Insufficient documentation

## 2020-07-17 DIAGNOSIS — B009 Herpesviral infection, unspecified: Secondary | ICD-10-CM

## 2020-07-17 DIAGNOSIS — O0990 Supervision of high risk pregnancy, unspecified, unspecified trimester: Secondary | ICD-10-CM

## 2020-07-17 DIAGNOSIS — O09523 Supervision of elderly multigravida, third trimester: Secondary | ICD-10-CM

## 2020-07-17 DIAGNOSIS — O133 Gestational [pregnancy-induced] hypertension without significant proteinuria, third trimester: Secondary | ICD-10-CM

## 2020-07-17 DIAGNOSIS — O24419 Gestational diabetes mellitus in pregnancy, unspecified control: Secondary | ICD-10-CM

## 2020-07-17 LAB — POCT URINALYSIS DIPSTICK OB
Blood, UA: NEGATIVE
Glucose, UA: NEGATIVE
Leukocytes, UA: NEGATIVE
Nitrite, UA: NEGATIVE
POC,PROTEIN,UA: NEGATIVE

## 2020-07-17 NOTE — Progress Notes (Signed)
HIGH-RISK PREGNANCY VISIT Patient name: Tiffany Dyer MRN [id number]  Date of birth: 06-Aug-1986 Chief Complaint:   Routine Prenatal Visit (NST/room # 8/[id number])  History of Present Illness:   Tiffany Dyer is a 34 y.o. G67P1011 female at [redacted]w[redacted]d with an Estimated Date of Delivery: 08/04/20 being seen today for ongoing management of a high-risk pregnancy complicated by diabetes mellitus A2DM currently on glyburide 2.5mg  AM/5mg  PM.  Today she reports FBS 82-117, majority >95, 2hr pp all normal but 1.  Depression screen [facility] 2/9 05/08/2020 01/25/2020 07/30/2017 03/17/2017 02/23/2017  Decreased Interest 0 0 0 0 0  Down, Depressed, Hopeless 0 0 0 0 0  PHQ - 2 Score 0 0 0 0 0  Altered sleeping 0 0 - 0 -  Tired, decreased energy 0 0 - 0 -  Change in appetite 0 0 - 0 -  Feeling bad or failure about yourself  0 0 - 0 -  Trouble concentrating 0 0 - 0 -  Moving slowly or fidgety/restless 0 0 - 0 -  Suicidal thoughts 0 0 - 0 -  PHQ-9 Score 0 0 - 0 -  Difficult doing work/chores Not difficult at all - - - -    Contractions: Irregular.  .  Movement: Present. denies leaking of fluid.  Review of Systems:   Pertinent items are noted in HPI Denies abnormal vaginal discharge w/ itching/odor/irritation, headaches, visual changes, shortness of breath, chest pain, abdominal pain, severe nausea/vomiting, or problems with urination or bowel movements unless otherwise stated above. Pertinent History Reviewed:  Reviewed past medical,surgical, social, obstetrical and family history.  Reviewed problem list, medications and allergies. Physical Assessment:   Vitals:   07/17/20 1501  BP: 136/86  Pulse: 92  Weight: 259 lb (117.5 kg)  Body mass index is 45.88 kg/m.           Physical Examination:   General appearance: alert, well appearing, and in no distress  Mental status: alert, oriented to person, place, and time  Skin: warm & dry   Extremities: Edema: None    Cardiovascular: normal heart rate  noted  Respiratory: normal respiratory effort, no distress  Abdomen: gravid, soft, non-tender  Pelvic: Cervical exam performed  Dilation: 1.5 Effacement (%): Thick Station: -3  Fetal Status: Fetal Heart Rate (bpm): 135   Movement: Present Presentation: Vertex  Fetal Surveillance Testing today: NST: FHR baseline 135 bpm, Variability: moderate, Accelerations:present, Decelerations:  Absent= Cat 1/Reactive Toco: none     Chaperone: Peggy Dones    Results for orders placed or performed in visit on 07/17/20 (from the past 24 hour(s))  POC Urinalysis Dipstick OB   Collection Time: 07/17/20  3:05 PM  Result Value Ref Range   Color, UA     Clarity, UA     Glucose, UA Negative Negative   Bilirubin, UA     Ketones, UA moderate    Spec Grav, UA     Blood, UA neg    pH, UA     POC,PROTEIN,UA Negative Negative, Trace, Small (1+), Moderate (2+), Large (3+), 4+   Urobilinogen, UA     Nitrite, UA neg    Leukocytes, UA Negative Negative   Appearance     Odor      Assessment & Plan:  1) High-risk pregnancy G3P1011 at 107w3d with an Estimated Date of Delivery: 08/04/20   2) A2DM, unstable, discussed w/ L[facility], will increase pm glyburide to 6.25mg  (pt had hypoglycemic episode to 30 w/ dosage increase in past, so hesitant to  go up too much at a time). EFW 45% @ 36wks  3) H/O GHTN, ASA, reviewed pre-e s/s, reasons to seek care  Meds: No orders of the defined types were placed in this encounter.   Labs/procedures today: nst, gbs, gc/ct, sve  Treatment Plan:  2x/wk testing, IOL @ 39wks  Reviewed: Term labor symptoms and general obstetric precautions including but not limited to vaginal bleeding, contractions, leaking of fluid and fetal movement were reviewed in detail with the patient.  All questions were answered.   Follow-up: Return for As scheduled fri nst/nurse.  Orders Placed This Encounter  Procedures  . Strep Gp B NAA  . POC Urinalysis Dipstick OB   Cheral Marker CNM,  [facility] 07/17/2020 5:00 PM

## 2020-07-17 NOTE — Patient Instructions (Signed)
Tiffany Dyer, I greatly value your feedback.  If you receive a survey following your visit with [place] today, we appreciate you taking the time to fill it out.  Thanks, [name], CNM, WHNP-BC  [facility] ([address] [place], [place] [address]) Entrance C, located off of [address] valet parking   Go to [email or web address] to register for FREE online childbirth classes    Call the office [phone]) or go to [facility] if:  You begin to have strong, frequent contractions  Your water breaks.  Sometimes it is a big gush of fluid, sometimes it is just a trickle that keeps getting your panties wet or running down your legs  You have vaginal bleeding.  It is normal to have a small amount of spotting if your cervix was checked.   You don't feel your baby moving like normal.  If you don't, get you something to eat and drink and lay down and focus on feeling your baby move.  You should feel at least 10 movements in 2 hours.  If you don't, you should call the office or go to [facility].   Call the office [phone]) or go to [facility] for these signs of pre-eclampsia:  Severe headache that does not go away with Tylenol  Visual changes- seeing spots, double, blurred vision  Pain under your right breast or upper abdomen that does not go away with Tums or heartburn medicine  Nausea and/or vomiting  Severe swelling in your hands, feet, and face    Home Blood Pressure Monitoring for Patients   Your provider has recommended that you check your blood pressure (BP) at least once a week at home. If you do not have a blood pressure cuff at home, one will be provided for you. Contact your provider if you have not received your monitor within 1 week.   Helpful Tips for Accurate Home Blood Pressure Checks   Don't smoke, exercise, or drink caffeine 30 minutes before checking your BP  Use the restroom before checking your BP (a full  bladder can raise your pressure)  Relax in a comfortable upright chair  Feet on the ground  Left arm resting comfortably on a flat surface at the level of your heart  Legs uncrossed  Back supported  Sit quietly and don't talk  Place the cuff on your bare arm  Adjust snuggly, so that only two fingertips can fit between your skin and the top of the cuff  Check 2 readings separated by at least one minute  Keep a log of your BP readings  For a visual, please reference this diagram: http://ccnc.care/bpdiagram  Provider Name: [facility]     Phone: [phone]  Zone 1: ALL CLEAR  Continue to monitor your symptoms:   BP reading is less than 140 (top number) or less than 90 (bottom number)   No right upper stomach pain  No headaches or seeing spots  No feeling nauseated or throwing up  No swelling in face and hands  Zone 2: CAUTION Call your doctor's office for any of the following:   BP reading is greater than 140 (top number) or greater than 90 (bottom number)   Stomach pain under your ribs in the middle or right side  Headaches or seeing spots  Feeling nauseated or throwing up  Swelling in face and hands  Zone 3: EMERGENCY  Seek immediate medical care if you have any of the  following:   BP reading is greater than160 (top number) or greater than 110 (bottom number)  Severe headaches not improving with Tylenol  Serious difficulty catching your breath  Any worsening symptoms from Zone 2   Braxton Hicks Contractions Contractions of the uterus can occur throughout pregnancy, but they are not always a sign that you are in labor. You may have practice contractions called Braxton Hicks contractions. These false labor contractions are sometimes confused with true labor. What are [name] contractions? Braxton Hicks contractions are tightening movements that occur in the muscles of the uterus before labor. Unlike true labor contractions, these  contractions do not result in opening (dilation) and thinning of the cervix. Toward the end of pregnancy (32-34 weeks), Braxton Hicks contractions can happen more often and may become stronger. These contractions are sometimes difficult to tell apart from true labor because they can be very uncomfortable. You should not feel embarrassed if you go to the hospital with false labor. Sometimes, the only way to tell if you are in true labor is for your health care provider to look for changes in the cervix. The health care provider will do a physical exam and may monitor your contractions. If you are not in true labor, the exam should show that your cervix is not dilating and your water has not broken. If there are no other health problems associated with your pregnancy, it is completely safe for you to be sent home with false labor. You may continue to have Braxton Hicks contractions until you go into true labor. How to tell the difference between true labor and false labor True labor  Contractions last 30-70 seconds.  Contractions become very regular.  Discomfort is usually felt in the top of the uterus, and it spreads to the lower abdomen and low back.  Contractions do not go away with walking.  Contractions usually become more intense and increase in frequency.  The cervix dilates and gets thinner. False labor  Contractions are usually shorter and not as strong as true labor contractions.  Contractions are usually irregular.  Contractions are often felt in the front of the lower abdomen and in the groin.  Contractions may go away when you walk around or change positions while lying down.  Contractions get weaker and are shorter-lasting as time goes on.  The cervix usually does not dilate or become thin. Follow these instructions at home:  1. Take over-the-counter and prescription medicines only as told by your health care provider. 2. Keep up with your usual exercises and follow other  instructions from your health care provider. 3. Eat and drink lightly if you think you are going into labor. 4. If Braxton Hicks contractions are making you uncomfortable: ? Change your position from lying down or resting to walking, or change from walking to resting. ? Sit and rest in a tub of warm water. ? Drink enough fluid to keep your urine pale yellow. Dehydration may cause these contractions. ? Do slow and deep breathing several times an hour. 5. Keep all follow-up prenatal visits as told by your health care provider. This is important. Contact a health care provider if:  You have a fever.  You have continuous pain in your abdomen. Get help right away if:  Your contractions become stronger, more regular, and closer together.  You have fluid leaking or gushing from your vagina.  You pass blood-tinged mucus (bloody show).  You have bleeding from your vagina.  You have low back pain  that you never had before.  You feel your babys head pushing down and causing pelvic pressure.  Your baby is not moving inside you as much as it used to. Summary  Contractions that occur before labor are called Braxton Hicks contractions, false labor, or practice contractions.  Braxton Hicks contractions are usually shorter, weaker, farther apart, and less regular than true labor contractions. True labor contractions usually become progressively stronger and regular, and they become more frequent.  Manage discomfort from [facility] contractions by changing position, resting in a warm bath, drinking plenty of water, or practicing deep breathing. This information is not intended to replace advice given to you by your health care provider. Make sure you discuss any questions you have with your health care provider. Document Revised: 10/02/2017 Document Reviewed: 03/05/2017 Elsevier Patient Education  Page.

## 2020-07-19 LAB — CERVICOVAGINAL ANCILLARY ONLY
Chlamydia: NEGATIVE
Comment: NEGATIVE
Comment: NORMAL
Neisseria Gonorrhea: NEGATIVE

## 2020-07-19 LAB — STREP GP B NAA: Strep Gp B NAA: NEGATIVE

## 2020-07-20 ENCOUNTER — Encounter: Payer: Self-pay | Admitting: *Deleted

## 2020-07-20 ENCOUNTER — Other Ambulatory Visit: Payer: Managed Care, Other (non HMO) | Admitting: Obstetrics & Gynecology

## 2020-07-20 ENCOUNTER — Other Ambulatory Visit: Payer: Self-pay

## 2020-07-20 ENCOUNTER — Ambulatory Visit (INDEPENDENT_AMBULATORY_CARE_PROVIDER_SITE_OTHER): Payer: Managed Care, Other (non HMO) | Admitting: *Deleted

## 2020-07-20 VITALS — BP 153/92

## 2020-07-20 DIAGNOSIS — Z331 Pregnant state, incidental: Secondary | ICD-10-CM | POA: Diagnosis not present

## 2020-07-20 DIAGNOSIS — Z1389 Encounter for screening for other disorder: Secondary | ICD-10-CM | POA: Diagnosis not present

## 2020-07-20 DIAGNOSIS — O24419 Gestational diabetes mellitus in pregnancy, unspecified control: Secondary | ICD-10-CM

## 2020-07-20 DIAGNOSIS — O0990 Supervision of high risk pregnancy, unspecified, unspecified trimester: Secondary | ICD-10-CM

## 2020-07-20 DIAGNOSIS — Z3A37 37 weeks gestation of pregnancy: Secondary | ICD-10-CM

## 2020-07-20 LAB — POCT URINALYSIS DIPSTICK OB
Glucose, UA: NEGATIVE
Leukocytes, UA: NEGATIVE
Nitrite, UA: NEGATIVE
POC,PROTEIN,UA: NEGATIVE

## 2020-07-20 NOTE — Treatment Plan (Signed)
   Induction Assessment Scheduling Form: Fax to [facility]:  [phone]  Tiffany Dyer                                                                                   DOB:  10-10-1986                                                            MRN:  [id number]                                                                     Phone #:                            Provider:  [facility]  GP:  [id number]                                                            Estimated Date of Delivery: 08/04/20  Dating Criteria: LMP 1st trimester sonogram    Medical Indications for induction:  Gestational hypertension Admission Date/Time:  07/21/20 [place] Gestational age on admission:  [redacted]w[redacted]d   There were no vitals filed for this visit. HIV:  Non Reactive (07/06 2094) GBS: Negative/-- (09/14 1630)  Cervix not examined   Method of induction(proposed):  choice   Scheduling Provider Signature:  Lazaro Arms, MD                                            Today's Date:  07/20/2020

## 2020-07-20 NOTE — Addendum Note (Signed)
Addended by: Lazaro Arms on: 07/20/2020 08:15 PM   Modules accepted: Orders, SmartSet

## 2020-07-20 NOTE — Progress Notes (Signed)
   NURSE VISIT- NST  SUBJECTIVE:  Tiffany Dyer is a 34 y.o. 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  She reports active fetal movement, contractions: none, vaginal bleeding: none, membranes: intact.   OBJECTIVE:  BP (!) 147/97   LMP 10/29/2019   Appears well, no apparent distress  Results for orders placed or performed in visit on 07/20/20 (from the past 24 hour(s))  POC Urinalysis Dipstick OB   Collection Time: 07/20/20 11:33 AM  Result Value Ref Range   Color, UA     Clarity, UA     Glucose, UA Negative Negative   Bilirubin, UA     Ketones, UA small    Spec Grav, UA     Blood, UA trace    pH, UA     POC,PROTEIN,UA Negative Negative, Trace, Small (1+), Moderate (2+), Large (3+), 4+   Urobilinogen, UA     Nitrite, UA neg    Leukocytes, UA Negative Negative   Appearance     Odor      NST: FHR baseline 130 bpm, Variability: moderate, Accelerations:present, Decelerations:  Absent= Cat 1/Reactive Toco: none   ASSESSMENT: G3P1011 at [redacted]w[redacted]d with A2DM currently on Glyburide NST reactive  PLAN: EFM strip reviewed by Dr. Despina Hidden   Recommendations: Scheduled for induction tonight d/t increased blood pressures.     Debbe Odea Justice Aguirre  07/20/2020 11:33 AM

## 2020-07-21 ENCOUNTER — Inpatient Hospital Stay (HOSPITAL_COMMUNITY)
Admission: AD | Admit: 2020-07-21 | Discharge: 2020-07-23 | DRG: 806 | Disposition: A | Discharge status: Home / Self Care | Payer: Managed Care, Other (non HMO) | Attending: Obstetrics and Gynecology | Admitting: Obstetrics and Gynecology

## 2020-07-21 ENCOUNTER — Other Ambulatory Visit: Payer: Self-pay

## 2020-07-21 ENCOUNTER — Inpatient Hospital Stay (HOSPITAL_COMMUNITY): Payer: Managed Care, Other (non HMO)

## 2020-07-21 ENCOUNTER — Inpatient Hospital Stay (HOSPITAL_COMMUNITY): Payer: Managed Care, Other (non HMO) | Admitting: Anesthesiology

## 2020-07-21 ENCOUNTER — Inpatient Hospital Stay (HOSPITAL_BASED_OUTPATIENT_CLINIC_OR_DEPARTMENT_OTHER): Payer: Managed Care, Other (non HMO)

## 2020-07-21 ENCOUNTER — Encounter (HOSPITAL_COMMUNITY): Payer: Self-pay | Admitting: Obstetrics & Gynecology

## 2020-07-21 DIAGNOSIS — Z3A38 38 weeks gestation of pregnancy: Secondary | ICD-10-CM | POA: Diagnosis not present

## 2020-07-21 DIAGNOSIS — O09529 Supervision of elderly multigravida, unspecified trimester: Secondary | ICD-10-CM

## 2020-07-21 DIAGNOSIS — O09293 Supervision of pregnancy with other poor reproductive or obstetric history, third trimester: Secondary | ICD-10-CM | POA: Diagnosis not present

## 2020-07-21 DIAGNOSIS — Z3A37 37 weeks gestation of pregnancy: Secondary | ICD-10-CM

## 2020-07-21 DIAGNOSIS — Z20822 Contact with and (suspected) exposure to covid-19: Secondary | ICD-10-CM | POA: Diagnosis present

## 2020-07-21 DIAGNOSIS — O09523 Supervision of elderly multigravida, third trimester: Secondary | ICD-10-CM

## 2020-07-21 DIAGNOSIS — O368390 Maternal care for abnormalities of the fetal heart rate or rhythm, unspecified trimester, not applicable or unspecified: Secondary | ICD-10-CM

## 2020-07-21 DIAGNOSIS — O134 Gestational [pregnancy-induced] hypertension without significant proteinuria, complicating childbirth: Principal | ICD-10-CM | POA: Diagnosis present

## 2020-07-21 DIAGNOSIS — O9832 Other infections with a predominantly sexual mode of transmission complicating childbirth: Secondary | ICD-10-CM | POA: Diagnosis present

## 2020-07-21 DIAGNOSIS — O24419 Gestational diabetes mellitus in pregnancy, unspecified control: Secondary | ICD-10-CM | POA: Diagnosis not present

## 2020-07-21 DIAGNOSIS — O133 Gestational [pregnancy-induced] hypertension without significant proteinuria, third trimester: Secondary | ICD-10-CM

## 2020-07-21 DIAGNOSIS — O368330 Maternal care for abnormalities of the fetal heart rate or rhythm, third trimester, not applicable or unspecified: Secondary | ICD-10-CM

## 2020-07-21 DIAGNOSIS — O24425 Gestational diabetes mellitus in childbirth, controlled by oral hypoglycemic drugs: Secondary | ICD-10-CM | POA: Diagnosis present

## 2020-07-21 DIAGNOSIS — O99214 Obesity complicating childbirth: Secondary | ICD-10-CM | POA: Diagnosis present

## 2020-07-21 DIAGNOSIS — B009 Herpesviral infection, unspecified: Secondary | ICD-10-CM | POA: Diagnosis present

## 2020-07-21 DIAGNOSIS — O139 Gestational [pregnancy-induced] hypertension without significant proteinuria, unspecified trimester: Secondary | ICD-10-CM | POA: Diagnosis present

## 2020-07-21 DIAGNOSIS — A6000 Herpesviral infection of urogenital system, unspecified: Secondary | ICD-10-CM | POA: Diagnosis present

## 2020-07-21 DIAGNOSIS — O0990 Supervision of high risk pregnancy, unspecified, unspecified trimester: Secondary | ICD-10-CM

## 2020-07-21 LAB — CBC
HCT: 35.4 % — ABNORMAL LOW (ref 36.0–46.0)
HCT: 33.8 % — ABNORMAL LOW (ref 36.0–46.0)
Hemoglobin: 11.9 g/dL — ABNORMAL LOW (ref 12.0–15.0)
Hemoglobin: 11.4 g/dL — ABNORMAL LOW (ref 12.0–15.0)
MCH: 32.4 pg (ref 26.0–34.0)
MCH: 31.8 pg (ref 26.0–34.0)
MCHC: 33.6 g/dL (ref 30.0–36.0)
MCHC: 33.7 g/dL (ref 30.0–36.0)
MCV: 96.5 fL (ref 80.0–100.0)
MCV: 94.2 fL (ref 80.0–100.0)
Platelets: 303 10*3/uL (ref 150–400)
Platelets: 260 10*3/uL (ref 150–400)
RBC: 3.67 MIL/uL — ABNORMAL LOW (ref 3.87–5.11)
RBC: 3.59 MIL/uL — ABNORMAL LOW (ref 3.87–5.11)
RDW: 14.9 % (ref 11.5–15.5)
RDW: 14.7 % (ref 11.5–15.5)
WBC: 8.2 10*3/uL (ref 4.0–10.5)
WBC: 8.9 10*3/uL (ref 4.0–10.5)
nRBC: 0 % (ref 0.0–0.2)
nRBC: 0 % (ref 0.0–0.2)

## 2020-07-21 LAB — COMPREHENSIVE METABOLIC PANEL
ALT: 19 U/L (ref 0–44)
AST: 21 U/L (ref 15–41)
Albumin: 2.9 g/dL — ABNORMAL LOW (ref 3.5–5.0)
Alkaline Phosphatase: 98 U/L (ref 38–126)
Anion gap: 10 (ref 5–15)
BUN: 9 mg/dL (ref 6–20)
CO2: 19 mmol/L — ABNORMAL LOW (ref 22–32)
Calcium: 9.3 mg/dL (ref 8.9–10.3)
Chloride: 107 mmol/L (ref 98–111)
Creatinine, Ser: 0.88 mg/dL (ref 0.44–1.00)
GFR calc Af Amer: >60 mL/min (ref >60)
GFR calc non Af Amer: >60 mL/min (ref >60)
Glucose, Bld: 165 mg/dL — ABNORMAL HIGH (ref 70–99)
Potassium: 4 mmol/L (ref 3.5–5.1)
Sodium: 136 mmol/L (ref 135–145)
Total Bilirubin: 0.7 mg/dL (ref 0.3–1.2)
Total Protein: 6 g/dL — ABNORMAL LOW (ref 6.5–8.1)

## 2020-07-21 LAB — URINALYSIS, ROUTINE W REFLEX MICROSCOPIC
Bilirubin Urine: NEGATIVE
Glucose, UA: NEGATIVE mg/dL
Hgb urine dipstick: NEGATIVE
Ketones, ur: 20 mg/dL — AB
Nitrite: NEGATIVE
Protein, ur: NEGATIVE mg/dL
Specific Gravity, Urine: 1.019 (ref 1.005–1.030)
pH: 5 (ref 5.0–8.0)

## 2020-07-21 LAB — PROTEIN / CREATININE RATIO, URINE
Creatinine, Urine: 127.47 mg/dL
Protein Creatinine Ratio: 0.14 mg/mg{Cre} (ref 0.00–0.15)
Total Protein, Urine: 18 mg/dL

## 2020-07-21 LAB — TYPE AND SCREEN
ABO/RH(D): A POS
Antibody Screen: NEGATIVE

## 2020-07-21 LAB — SARS CORONAVIRUS 2 BY RT PCR (HOSPITAL ORDER, PERFORMED IN ~~LOC~~ HOSPITAL LAB): SARS Coronavirus 2: NEGATIVE

## 2020-07-21 LAB — GLUCOSE, CAPILLARY
Glucose-Capillary: 106 mg/dL — ABNORMAL HIGH (ref 70–99)
Glucose-Capillary: 62 mg/dL — ABNORMAL LOW (ref 70–99)
Glucose-Capillary: 76 mg/dL (ref 70–99)

## 2020-07-21 MED ORDER — SODIUM CHLORIDE (PF) 0.9 % IJ SOLN
INTRAMUSCULAR | Status: DC | PRN
Start: 2020-07-21 — End: 2020-07-25
  Administered 2020-07-21: 12 mL/h via EPIDURAL

## 2020-07-21 MED ORDER — FENTANYL CITRATE (PF) 100 MCG/2ML IJ SOLN: 50.0000 ug | INTRAMUSCULAR | Status: DC | PRN

## 2020-07-21 MED ORDER — OXYTOCIN-SODIUM CHLORIDE 30-0.9 UT/500ML-% IV SOLN
1.0000 m[IU]/min | INTRAVENOUS | Status: DC
  Administered 2020-07-21: 2 m[IU]/min via INTRAVENOUS
  Filled 2020-07-21: qty 500

## 2020-07-21 MED ORDER — FENTANYL CITRATE (PF) 100 MCG/2ML IJ SOLN
INTRAMUSCULAR | Status: AC
  Administered 2020-07-21: 50 ug via INTRAVENOUS
  Filled 2020-07-21: qty 2

## 2020-07-21 MED ORDER — MISOPROSTOL 25 MCG QUARTER TABLET: 25.0000 ug | ORAL_TABLET | ORAL | Status: DC | PRN

## 2020-07-21 MED ORDER — EPHEDRINE 5 MG/ML INJ: 10.0000 mg | INTRAVENOUS | Status: DC | PRN

## 2020-07-21 MED ORDER — TERBUTALINE SULFATE 1 MG/ML IJ SOLN: 0.2500 mg | Freq: Once | INTRAMUSCULAR | Status: DC | PRN

## 2020-07-21 MED ORDER — PHENYLEPHRINE 40 MCG/ML (10ML) SYRINGE FOR IV PUSH (FOR BLOOD PRESSURE SUPPORT): 80.0000 ug | PREFILLED_SYRINGE | INTRAVENOUS | Status: DC | PRN

## 2020-07-21 MED ORDER — LACTATED RINGERS IV SOLN: 500.0000 mL | Freq: Once | INTRAVENOUS | Status: DC

## 2020-07-21 MED ORDER — ONDANSETRON HCL 4 MG/2ML IJ SOLN: 4.0000 mg | Freq: Four times a day (QID) | INTRAMUSCULAR | Status: DC | PRN

## 2020-07-21 MED ORDER — DIPHENHYDRAMINE HCL 50 MG/ML IJ SOLN: 12.5000 mg | INTRAMUSCULAR | Status: DC | PRN

## 2020-07-21 MED ORDER — OXYCODONE-ACETAMINOPHEN 5-325 MG PO TABS: 2.0000 | ORAL_TABLET | ORAL | Status: DC | PRN

## 2020-07-21 MED ORDER — LACTATED RINGERS IV SOLN: INTRAVENOUS | Status: DC

## 2020-07-21 MED ORDER — LIDOCAINE HCL (PF) 1 % IJ SOLN
INTRAMUSCULAR | Status: DC | PRN
  Administered 2020-07-21: 11 mL via EPIDURAL

## 2020-07-21 MED ORDER — OXYTOCIN-SODIUM CHLORIDE 30-0.9 UT/500ML-% IV SOLN: 2.5000 [IU]/h | INTRAVENOUS | Status: DC

## 2020-07-21 MED ORDER — FENTANYL-BUPIVACAINE-NACL 0.5-0.125-0.9 MG/250ML-% EP SOLN
12.0000 mL/h | EPIDURAL | Status: DC | PRN
  Filled 2020-07-21: qty 250

## 2020-07-21 MED ORDER — SOD CITRATE-CITRIC ACID 500-334 MG/5ML PO SOLN: 30.0000 mL | ORAL | Status: DC | PRN

## 2020-07-21 MED ORDER — ACETAMINOPHEN 325 MG PO TABS: 650.0000 mg | ORAL_TABLET | ORAL | Status: DC | PRN

## 2020-07-21 MED ORDER — OXYTOCIN BOLUS FROM INFUSION: 333.0000 mL | Freq: Once | INTRAVENOUS | Status: DC

## 2020-07-21 MED ORDER — LIDOCAINE HCL (PF) 1 % IJ SOLN
30.0000 mL | INTRAMUSCULAR | Status: AC | PRN
  Administered 2020-07-22: 30 mL via SUBCUTANEOUS
  Filled 2020-07-21: qty 30

## 2020-07-21 MED ORDER — OXYCODONE-ACETAMINOPHEN 5-325 MG PO TABS: 1.0000 | ORAL_TABLET | ORAL | Status: DC | PRN

## 2020-07-21 MED ORDER — LACTATED RINGERS IV SOLN: 500.0000 mL | INTRAVENOUS | Status: DC | PRN

## 2020-07-21 NOTE — Anesthesia Procedure Notes (Signed)
Epidural Patient location during procedure: OB Start time: 07/21/2020 9:23 PM End time: 07/21/2020 9:35 PM  Staffing Anesthesiologist: Lowella Curb, MD Performed: anesthesiologist   Preanesthetic Checklist Completed: patient identified, IV checked, site marked, risks and benefits discussed, surgical consent, monitors and equipment checked, pre-op evaluation and timeout performed  Epidural Patient position: sitting Prep: ChloraPrep Patient monitoring: heart rate, cardiac monitor, continuous pulse ox and blood pressure Approach: midline Location: L2-L3 Injection technique: LOR saline  Needle:  Needle type: Tuohy  Needle gauge: 17 G Needle length: 9 cm Needle insertion depth: 7 cm Catheter type: closed end flexible Catheter size: 20 Guage Catheter at skin depth: 12 cm Test dose: negative  Assessment Events: blood not aspirated, injection not painful, no injection resistance, no paresthesia and negative IV test  Additional Notes Reason for block:procedure for pain

## 2020-07-21 NOTE — Progress Notes (Signed)
LABOR PROGRESS NOTE  Tiffany Dyer is a 34 y.o. G3P1011 at [redacted]w[redacted]d  admitted for IOL due to A2GDM and gHTN.   Subjective: Doing well, feeling some back pains w contractions.   Objective: BP (!) 142/64   Pulse 66   Temp 98.5 F (36.9 C) (Oral)   Resp 18   Ht 5\' 3"  (1.6 m)   Wt 116.6 kg   LMP 10/29/2019   SpO2 99%   BMI 45.53 kg/m  or  Vitals:   07/21/20 1830 07/21/20 1900 07/21/20 1930 07/21/20 2000  BP: (!) 144/87 117/73 120/64 (!) 142/64  Pulse: 67 70 74 66  Resp: 20 18 18    Temp:      TempSrc:      SpO2:      Weight:      Height:       Dilation: 2 Effacement (%): 80 Cervical Position: Middle Station: [place], -2 Presentation: Vertex Exam by:: Roxie Kreeger, MD   Assessment / Plan: 35 y.o. G3P1011 at [redacted]w[redacted]d here for IOL due to A2GDM and GHTN.   #Induction of Labor:  S/p 6/8 BPP and passed CST. FHT has improved overall and will proceed with IOL at this time. FB placed, Pitocin currently running at 10.    #Pain Control: Desires epidural  #Anticipated MOD: Hopeful SVD  #GHTN:  No severe range. Pre-e labs wnl. Asymptomatic.   #A2GDM:  CBG 106>62. Asx. Took home glyburide this AM. Holding glyburide since inpatient.  --Monitor q 4 CBGs.     [redacted]w[redacted]d, MD [facility] Family [facility] Fellow, [facility] for [facility], [facility]

## 2020-07-21 NOTE — Progress Notes (Signed)
OB Note D/w pt and partner re: plan of care. Patient with category I tracing with accels on admission for IOL for gHTN. Pt also with GDMa2. Fetus then became category II due to some rare ?late decels and no more decels but usually minimal variability with periods of moderate variability, no accels. BPP done and 6/8 (-2 for breathing). Fetus currently category II with min to moderate variability, no accels or decels.  D/w them I recommend doing a CST and if negative then can proceed with IOL which I would recommend pit and foley bulb. If not, then recommend c/s. They are amenable to plan. Continue npo until after CST   Cornelia Copa MD Attending Center for [employer] (Faculty Practice) 07/21/2020 Time: [phone]

## 2020-07-21 NOTE — Anesthesia Preprocedure Evaluation (Signed)
Anesthesia Evaluation  Patient identified by MRN, date of birth, ID band Patient awake    Reviewed: Allergy & Precautions, H&P , Patient's Chart, lab work & pertinent test results  Airway Mallampati: II  TM Distance: >3 FB Neck ROM: full    Dental no notable dental hx.    Pulmonary    Pulmonary exam normal breath sounds clear to auscultation       Cardiovascular Exercise Tolerance: Good hypertension,  Rhythm:regular Rate:Normal     Neuro/Psych    GI/Hepatic   Endo/Other  diabetes, GestationalMorbid obesity  Renal/GU      Musculoskeletal   Abdominal (+) + obese,   Peds  Hematology   Anesthesia Other Findings   Reproductive/Obstetrics                             Anesthesia Physical  Anesthesia Plan  ASA: III  Anesthesia Plan: Epidural   Post-op Pain Management:    Induction:   PONV Risk Score and Plan:   Airway Management Planned:   Additional Equipment:   Intra-op Plan:   Post-operative Plan:   Informed Consent: I have reviewed the patients History and Physical, chart, labs and discussed the procedure including the risks, benefits and alternatives for the proposed anesthesia with the patient or authorized representative who has indicated his/her understanding and acceptance.     Dental Advisory Given  Plan Discussed with:   Anesthesia Plan Comments: (Labs checked- platelets confirmed with RN in room. Fetal heart tracing, per RN, reported to be stable enough for sitting procedure. Discussed epidural, and patient consents to the procedure:  included risk of possible headache,backache, failed block, allergic reaction, and nerve injury. This patient was asked if she had any questions or concerns before the procedure started.)        Anesthesia Quick Evaluation

## 2020-07-21 NOTE — Progress Notes (Signed)
LABOR PROGRESS NOTE  Tiffany Dyer is a 34 y.o. G3P1011 at [redacted]w[redacted]d  admitted for IOL due to A2GDM and gHTN.   Subjective: Doing well, not feeling many contractions yet.   Objective: BP (!) 141/82   Pulse 67   Temp 98.2 F (36.8 C) (Oral)   Resp 18   Ht 5\' 3"  (1.6 m)   Wt 116.6 kg   LMP 10/29/2019   SpO2 99%   BMI 45.53 kg/m  or  Vitals:   07/21/20 1500 07/21/20 1530 07/21/20 1600 07/21/20 1630  BP: (!) 112/58 (!) 115/59 138/87 (!) 141/82  Pulse: 78 72 68 67  Resp: 18 18 18 18   Temp:      TempSrc:      SpO2:      Weight:      Height:       Dilation: 1.5 Effacement (%): Thick Cervical Position: Middle Station: [place], -3 Presentation: Vertex Exam by:: [place], RNC  Assessment / Plan: 34 y.o. G3P1011 at [redacted]w[redacted]d here for IOL due to A2GDM and GHTN.   Labor: On arrival, FHT with intermittent variable decelerations and then with subsequent minimal variability. Fortunately, variability has improved with more frequent accels following fluid bolus and position changes. BPP 6/8. Will proceed with contraction testing with pit. If fetal intolerance continually noted, will consider proceeding with C/S.     Pain Control:  Desires epidural Anticipated MOD: Hopeful SVD  #GHTN:  No severe range. Pre-e labs wnl. Asymptomatic.   #A2GDM:  CBG 106>62. Asx.  --Monitor q 4 CBGs.   [redacted]w[redacted]d, DO  Family Medicine PGY-3  07/21/2020, 5:13 PM

## 2020-07-21 NOTE — H&P (Addendum)
LABOR & DELIVERY ADMISSION HISTORY AND PHYSICAL  Tiffany Dyer is a 34 y.o. female G17P1011 with IUP at [redacted]w[redacted]d presenting for IUD 2/2 gHTN and A2GDM. She reports +FMs, No LOF, no VB, no blurry vision, headaches or peripheral edema, and RUQ pain. She plans on breast feeding. She is undecided on birth control. She received her prenatal care at [facility]   Dating: By LMP --->  Estimated Date of Delivery: 08/04/20  Sono:    @[redacted]w[redacted]d , CWD, normal anatomy, cephalic presentation, anterior placenta, 2949g, 45% EFW  Prenatal History/Complications:  - gHTN (153/92 yesterday), PEC labs normal  - advanced maternal age (21) - A2GDM, on glyburide - hx of HSV on prophylaxis   Past Medical History: Past Medical History:  Diagnosis Date  . Genital herpes 2008  . Gestational diabetes   . Pregnancy induced hypertension     Past Surgical History: Past Surgical History:  Procedure Laterality Date  . CHOLECYSTECTOMY    . INDUCED ABORTION    . WISDOM TOOTH EXTRACTION      Obstetrical History: OB History    Gravida  3   Para  1   Term  1   Preterm      AB  1   Living  1     SAB      TAB      Ectopic      Multiple  0   Live Births  1           Social History Social History   Socioeconomic History  . Marital status: Married    Spouse name: Amondo  . Number of children: Not on file  . Years of education: Not on file  . Highest education level: Not on file  Occupational History  . Not on file  Tobacco Use  . Smoking status: Never Smoker  . Smokeless tobacco: Never Used  Vaping Use  . Vaping Use: Never used  Substance and Sexual Activity  . Alcohol use: No  . Drug use: No  . Sexual activity: Yes    Birth control/protection: None  Other Topics Concern  . Not on file  Social History Narrative  . Not on file   Social Determinants of Health   Financial Resource Strain: Low Risk   . Difficulty of Paying Living Expenses: Not hard at all  Food Insecurity: No  Food Insecurity  . Worried About 2009 in the Last Year: Never true  . Ran Out of Food in the Last Year: Never true  Transportation Needs: No Transportation Needs  . Lack of Transportation (Medical): No  . Lack of Transportation (Non-Medical): No  Physical Activity: Insufficiently Active  . Days of Exercise per Week: 1 day  . Minutes of Exercise per Session: 20 min  Stress: No Stress Concern Present  . Feeling of Stress : Not at all  Social Connections: Moderately Integrated  . Frequency of Communication with Friends and Family: More than three times a week  . Frequency of Social Gatherings with Friends and Family: Once a week  . Attends Religious Services: More than 4 times per year  . Active Member of Clubs or Organizations: No  . Attends Programme researcher, broadcasting/film/video Meetings: Never  . Marital Status: Married    Family History: Family History  Problem Relation Age of Onset  . Other Paternal Grandfather        poor circulation  . Thyroid disease Paternal Grandfather   . Diabetes Paternal Grandmother   . Hypertension  Maternal Grandmother   . Cancer Maternal Grandfather        kidney  . Diabetes Father     Allergies: No Known Allergies  Medications Prior to Admission  Medication Sig Dispense Refill Last Dose  . acyclovir (ZOVIRAX) 400 MG tablet Take 1 tablet (400 mg total) by mouth 3 (three) times daily. 90 tablet 2 07/20/2020 at Unknown time  . aspirin 81 MG chewable tablet Chew 2 tablets (162 mg total) by mouth daily. 60 tablet 8 07/20/2020 at Unknown time  . Blood Pressure Monitor MISC For regular home bp monitoring during pregnancy 1 each 0 07/20/2020 at Unknown time  . Doxylamine Succinate, Sleep, (UNISOM PO) Take by mouth.   07/20/2020 at Unknown time  . glucose blood test strip Use as instructed 100 each 12 07/20/2020 at Unknown time  . glyBURIDE (DIABETA) 5 MG tablet 1/2 tablet in am, 1 tablet at supper (Patient taking differently: 2.5 tablet in am, 6.25 mgtablet at  supper) 60 tablet 2 07/20/2020 at Unknown time  . omeprazole (PRILOSEC) 20 MG capsule Take 1 capsule (20 mg total) by mouth daily. 1 tablet a day 30 capsule 6 07/20/2020 at Unknown time  . Prenatal Vit-Fe Fumarate-FA (PRENATAL VITAMIN PO) Take 2 tablets by mouth daily. One a day brand   07/20/2020 at Unknown time  . vitamin B-6 (PYRIDOXINE) 25 MG tablet Take 25 mg by mouth daily.   07/20/2020 at Unknown time     Review of Systems   All systems reviewed and negative except as stated in HPI  Blood pressure (!) 144/75, pulse 94, temperature 98.2 F (36.8 C), temperature source Oral, resp. rate 18, height 5\' 3"  (1.6 m), weight 116.6 kg, last menstrual period 10/29/2019. General appearance: alert and no distress Lungs: clear to auscultation bilaterally Heart: regular rate and rhythm Abdomen: soft, non-tender; bowel sounds normal Pelvic: 1.5, thick, -3 Extremities: Homans sign is negative, no sign of DVT Presentation: cephalic Fetal monitoringBaseline: 165 bpm, Variability: minimal to moderate , Accelerations: absent and Decelerations: intermitent variables - overall Cat II tracing  Uterine activityNone    Prenatal labs: ABO, Rh: A/Positive/-- (03/24 1600) Antibody: Negative (07/06 0836) Rubella: 1.74 (03/24 1600) RPR: Non Reactive (07/06 0836)  HBsAg: Negative (03/24 1600)  HIV: Non Reactive (07/06 0836)  GBS: Negative/-- (09/14 1630)  1 hr Glucola 03-28-2005 (05/08/2020) Genetic screening negative Anatomy 07/09/2020 normal  Prenatal Transfer Tool  Maternal Diabetes: Yes:  Diabetes Type:  Insulin/Medication controlled Genetic Screening: Normal Maternal Ultrasounds/Referrals: Normal Fetal Ultrasounds or other Referrals:  None Maternal Substance Abuse:  No Significant Maternal Medications:  None Significant Maternal Lab Results: Group B Strep negative  Results for orders placed or performed in visit on 07/20/20 (from the past 24 hour(s))  POC Urinalysis Dipstick OB   Collection Time:  07/20/20 11:33 AM  Result Value Ref Range   Color, UA     Clarity, UA     Glucose, UA Negative Negative   Bilirubin, UA     Ketones, UA small    Spec Grav, UA     Blood, UA trace    pH, UA     POC,PROTEIN,UA Negative Negative, Trace, Small (1+), Moderate (2+), Large (3+), 4+   Urobilinogen, UA     Nitrite, UA neg    Leukocytes, UA Negative Negative   Appearance     Odor      Patient Active Problem List   Diagnosis Date Noted  . Gestational hypertension 07/21/2020  . Gestational diabetes mellitus, class A2 05/09/2020  .  Supervision of high risk pregnancy, antepartum 01/25/2020  . AMA (advanced maternal age) multigravida 35+ 01/25/2020  . History of gestational hypertension 11/26/2017  . History of gestational diabetes 11/26/2017  . 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 G3P1011 at [redacted]w[redacted]d here for IUD 2/2 gHTN and A2GDM.   #Induction of Labor: On arrival patient has category II strip despite repositioning, IV Hydration.   -bedside US unremarkable, will send for BPP and reevaluate. May consider CST to assess if proceeding with IOL.   #Pain: wants epidural #FWB: cat II   #ID: GBS negative #MOF: breast #MOC: undecided #Circ: N/A (girl)   Dagoberto Ligas, MS3  I saw and evaluated the patient. I agree with the findings and the plan of care as documented in the medical student note and have made edits as necessary. On arrival patient FHT with minimal to moderate variability, no accels, intermittent variable decels. Most recently has had some accelerations, some intermittent improvement in variability. Pending BPP. Will continue to monitor.   Casper Harrison, MD [facility] Fellow, [facility] for [facility], [facility]   Annia Friendly, Medical Student  07/21/2020, 10:56 AM

## 2020-07-22 ENCOUNTER — Encounter (HOSPITAL_COMMUNITY): Payer: Self-pay | Admitting: Obstetrics & Gynecology

## 2020-07-22 DIAGNOSIS — O24425 Gestational diabetes mellitus in childbirth, controlled by oral hypoglycemic drugs: Secondary | ICD-10-CM

## 2020-07-22 DIAGNOSIS — Z3A38 38 weeks gestation of pregnancy: Secondary | ICD-10-CM

## 2020-07-22 DIAGNOSIS — O134 Gestational [pregnancy-induced] hypertension without significant proteinuria, complicating childbirth: Secondary | ICD-10-CM

## 2020-07-22 LAB — GLUCOSE, CAPILLARY: Glucose-Capillary: 119 mg/dL — ABNORMAL HIGH (ref 70–99)

## 2020-07-22 LAB — RPR: RPR Ser Ql: NONREACTIVE

## 2020-07-22 MED ORDER — ONDANSETRON HCL 4 MG PO TABS: 4.0000 mg | ORAL_TABLET | ORAL | Status: DC | PRN

## 2020-07-22 MED ORDER — ACETAMINOPHEN 325 MG PO TABS
650.0000 mg | ORAL_TABLET | Freq: Four times a day (QID) | ORAL | Status: DC
  Administered 2020-07-22 – 2020-07-23 (×6): 650 mg via ORAL
  Filled 2020-07-22 (×6): qty 2

## 2020-07-22 MED ORDER — ONDANSETRON HCL 4 MG/2ML IJ SOLN: 4.0000 mg | INTRAMUSCULAR | Status: DC | PRN

## 2020-07-22 MED ORDER — HYDRALAZINE HCL 20 MG/ML IJ SOLN: 10.0000 mg | INTRAMUSCULAR | Status: DC | PRN

## 2020-07-22 MED ORDER — SENNOSIDES-DOCUSATE SODIUM 8.6-50 MG PO TABS
2.0000 | ORAL_TABLET | ORAL | Status: DC
  Administered 2020-07-22: 2 via ORAL
  Filled 2020-07-22: qty 2

## 2020-07-22 MED ORDER — TETANUS-DIPHTH-ACELL PERTUSSIS 5-2.5-18.5 LF-MCG/0.5 IM SUSP: 0.5000 mL | Freq: Once | INTRAMUSCULAR | Status: DC

## 2020-07-22 MED ORDER — PRENATAL MULTIVITAMIN CH
1.0000 | ORAL_TABLET | Freq: Every day | ORAL | Status: DC
  Administered 2020-07-22 – 2020-07-23 (×2): 1 via ORAL
  Filled 2020-07-22 (×2): qty 1

## 2020-07-22 MED ORDER — BENZOCAINE-MENTHOL 20-0.5 % EX AERO
1.0000 "application " | INHALATION_SPRAY | CUTANEOUS | Status: DC | PRN
  Administered 2020-07-23: 1 via TOPICAL
  Filled 2020-07-22: qty 56

## 2020-07-22 MED ORDER — WITCH HAZEL-GLYCERIN EX PADS: 1.0000 "application " | MEDICATED_PAD | CUTANEOUS | Status: DC | PRN

## 2020-07-22 MED ORDER — LABETALOL HCL 5 MG/ML IV SOLN: 20.0000 mg | INTRAVENOUS | Status: DC | PRN

## 2020-07-22 MED ORDER — ZOLPIDEM TARTRATE 5 MG PO TABS: 5.0000 mg | ORAL_TABLET | Freq: Every evening | ORAL | Status: DC | PRN

## 2020-07-22 MED ORDER — SIMETHICONE 80 MG PO CHEW: 80.0000 mg | CHEWABLE_TABLET | ORAL | Status: DC | PRN

## 2020-07-22 MED ORDER — IBUPROFEN 600 MG PO TABS
600.0000 mg | ORAL_TABLET | Freq: Three times a day (TID) | ORAL | Status: DC
  Administered 2020-07-22 – 2020-07-23 (×4): 600 mg via ORAL
  Filled 2020-07-22 (×5): qty 1

## 2020-07-22 MED ORDER — DIBUCAINE (PERIANAL) 1 % EX OINT: 1.0000 "application " | TOPICAL_OINTMENT | CUTANEOUS | Status: DC | PRN

## 2020-07-22 MED ORDER — COCONUT OIL OIL
1.0000 "application " | TOPICAL_OIL | Status: DC | PRN
  Administered 2020-07-23: 1 via TOPICAL

## 2020-07-22 MED ORDER — LABETALOL HCL 5 MG/ML IV SOLN: 80.0000 mg | INTRAVENOUS | Status: DC | PRN

## 2020-07-22 MED ORDER — DIPHENHYDRAMINE HCL 25 MG PO CAPS: 25.0000 mg | ORAL_CAPSULE | Freq: Four times a day (QID) | ORAL | Status: DC | PRN

## 2020-07-22 MED ORDER — LABETALOL HCL 5 MG/ML IV SOLN: 40.0000 mg | INTRAVENOUS | Status: DC | PRN

## 2020-07-22 NOTE — Anesthesia Postprocedure Evaluation (Signed)
Anesthesia Post Note  Patient: Tiffany Dyer  Procedure(s) Performed: AN AD HOC LABOR EPIDURAL     Patient location during evaluation: Mother Baby Anesthesia Type: Epidural Level of consciousness: awake and alert and oriented Pain management: satisfactory to patient Vital Signs Assessment: post-procedure vital signs reviewed and stable Respiratory status: respiratory function stable Cardiovascular status: stable Postop Assessment: no headache, no backache, epidural receding, patient able to bend at knees, no signs of nausea or vomiting and adequate PO intake Anesthetic complications: no   No complications documented.  Last Vitals:  Vitals:   07/22/20 0505 07/22/20 0609  BP: (!) 147/55 124/77  Pulse: 68 78  Resp: 18 18  Temp: 36.8 C   SpO2: 100% 100%    Last Pain:  Vitals:   07/22/20 0505  TempSrc: Oral  PainSc: 0-No pain   Pain Goal:                   Treshaun Carrico

## 2020-07-22 NOTE — Progress Notes (Signed)
LABOR PROGRESS NOTE  Tiffany Dyer is a 34 y.o. G3P1011 at [redacted]w[redacted]d  admitted for IOL due to A2GDM and gHTN.   Subjective: Doing well. Pain well controlled with contractions.   Objective: BP (!) 111/53   Pulse 67   Temp 98.1 F (36.7 C) (Oral)   Resp 18   Ht 5\' 3"  (1.6 m)   Wt 116.6 kg   LMP 10/29/2019   SpO2 100%   BMI 45.53 kg/m  or  Vitals:   07/21/20 2250 07/21/20 2300 07/21/20 2330 07/22/20 0000  BP: 117/60 125/62 (!) 119/56 (!) 111/53  Pulse: 74 74 67 67  Resp: 18 18 18 18   Temp:      TempSrc:      SpO2:      Weight:      Height:       Dilation: 6 Effacement (%): 90 Cervical Position: Middle Station: Ballotable, -1 Presentation: Vertex Exam by:: J Mbugua  Assessment / Plan: 34 y.o. G3P1011 at [redacted]w[redacted]d here for IOL due to A2GDM and GHTN.   #Induction of Labor: Pitocin started at 1715 with subsequent FB placed at 2000, now out. Pt now progressing well with up-titration of pitocin.  #FWB: S/p 6/8 BPP and passed CST. Persistent Category 2 strip secondary to minimal variability but reassuringly +accels and no decels. Will continue to monitor.  #Pain Control: Epidural in place.  #Anticipated MOD: Anticipate SVD  #GHTN:  No severe range. Pre-e labs wnl. Asymptomatic.   #A2GDM:  CBG 106>62 >76. Asx. Took home glyburide this AM. Holding glyburide since inpatient.  --Monitor q 2 CBGs in active labor.    [redacted]w[redacted]d, MD OB Fellow, Faculty Practice 07/22/2020 12:40 AM

## 2020-07-22 NOTE — Lactation Note (Addendum)
This note was copied from a baby's chart. Lactation Consultation Note  Patient Name: Tiffany Dyer Today's Date: 07/22/2020   Initial visit at 6 hours of life. Mom is a P2 who nursed her 1st child for 14 months. Mom reports it took 4 days for her milk to come to volume. Once it did, Mom could typically pump 6-8oz/session. Mom has a Spectra pump at home.  Hand expression was taught to Mom. Mom has dense breast tissue, which makes hand expression somewhat more difficult. Mom is amenable to pumping. Will return to set up a DEBP.   Lurline Hare [facility] 07/22/2020, 9:49 AM

## 2020-07-22 NOTE — Lactation Note (Addendum)
This note was copied from a baby's chart. Lactation Consultation Note  Patient Name: Girl Katheleen Jones-Garrett Today's Date: 07/22/2020 Reason for consult: Initial assessment  Mom was shown how to use DEBP & how to disassemble, clean, & reassemble parts. Mom was also shown how to assemble & use hand pump that was included in pump kit.   Initially, it appeared that Mom needed size 21 flanges when pumping. However, I recommended that Mom use size 24 flanges for the next pumping session. Mom to add in pumping if infant does not wake to feed, if formula is given, or as Mom desires.   Mom is aware of initial sleepy newborn phase.   Mom was made aware of O/P services, breastfeeding support groups (including [employer]), community resources, and our phone # for post-discharge questions.   Mom says she has no additional questions at this time. Comfort Gels were provided w/instructions for use (for Mom to have for use at home, per her request, or if needed while in the hospital).   Matthias Hughs [facility] 07/22/2020, 12:28 PM

## 2020-07-22 NOTE — Discharge Summary (Signed)
Postpartum Discharge Summary     Patient Name: Tiffany Dyer DOB: 1986/09/04 MRN: [id number]  Date of admission: 07/21/2020 Delivery date:07/22/2020  Delivering provider: Randa Ngo  Date of discharge: 07/23/2020  Admitting diagnosis: Gestational hypertension [O13.9] Intrauterine pregnancy: [redacted]w[redacted]d    Secondary diagnosis:  Active Problems:   HSV-2 infection   Vaginal delivery   AMA (advanced maternal age) multigravida 35+   Gestational diabetes mellitus, class A2   Gestational hypertension  Additional problems: as noted above   Discharge diagnosis: Vagina delivery                                        Post partum procedures:none Augmentation: Pitocin and IP Foley Complications: None  Hospital course: Induction of Labor With Vaginal Delivery   34y.o. yo [id number] admitted to the hospital 07/21/2020 for induction of labor.  Indication for induction: Gestational hypertension and A2 DM.  Patient had an uncomplicated labor course as follows: Membrane Rupture Time/Date: 9:59 PM ,07/21/2020   Delivery Method:Vaginal, Spontaneous  Episiotomy: None  Lacerations:  1st degree;Perineal  Details of delivery can be found in separate delivery note.  Patient had a routine postpartum course. Patient is discharged home 07/23/20.  Newborn Data: Birth date:07/22/2020  Birth time:2:55 AM  Gender:Female  Living status:Living  Apgars:9 ,9  Weight:2821 g   Magnesium Sulfate received: No BMZ received: No Rhophylac:N/A MMR:N/A T-DaP:Given prenatally- 06/08/20 Flu: No Transfusion:No  Physical exam  Vitals:   07/22/20 2100 07/23/20 0503 07/23/20 1257 07/23/20 1403  BP: 131/72 138/64 134/66 130/70  Pulse: 79 77 75 79  Resp: '18  17 16  ' Temp: 98.6 F (37 C) 98.2 F (36.8 C)    TempSrc: Oral Oral    SpO2:  99% 99%   Weight:      Height:       General: alert, cooperative and no distress Lochia: appropriate Uterine Fundus: firm Incision: N/A DVT Evaluation: No  evidence of DVT seen on physical exam. No cords or calf tenderness. No significant calf/ankle edema. Labs: Lab Results  Component Value Date   WBC 8.9 07/21/2020   HGB 11.4 (L) 07/21/2020   HCT 33.8 (L) 07/21/2020   MCV 94.2 07/21/2020   PLT 260 07/21/2020   CMP Latest Ref Rng & Units 07/23/2020  Glucose 70 - 99 mg/dL 105(H)  BUN 6 - 20 mg/dL -  Creatinine 0.44 - 1.00 mg/dL -  Sodium 135 - 145 mmol/L -  Potassium 3.5 - 5.1 mmol/L -  Chloride 98 - 111 mmol/L -  CO2 22 - 32 mmol/L -  Calcium 8.9 - 10.3 mg/dL -  Total Protein 6.5 - 8.1 g/dL -  Total Bilirubin 0.3 - 1.2 mg/dL -  Alkaline Phos 38 - 126 U/L -  AST 15 - 41 U/L -  ALT 0 - 44 U/L -   Edinburgh Score: Edinburgh Postnatal Depression Scale Screening Tool 07/22/2020  I have been able to laugh and see the funny side of things. (No Data)  I have looked forward with enjoyment to things. -  I have blamed myself unnecessarily when things went wrong. -  I have been anxious or worried for no good reason. -  I have felt scared or panicky for no good reason. -  Things have been getting on top of me. -  I have been so unhappy that I have had difficulty  sleeping. -  I have felt sad or miserable. -  I have been so unhappy that I have been crying. -  The thought of harming myself has occurred to me. Flavia Shipper Postnatal Depression Scale Total -     After visit meds:  Allergies as of 07/23/2020   No Known Allergies     Medication List    STOP taking these medications   aspirin 81 MG chewable tablet   glyBURIDE 5 MG tablet Commonly known as: DIABETA     TAKE these medications   acyclovir 400 MG tablet Commonly known as: ZOVIRAX Take 1 tablet (400 mg total) by mouth 3 (three) times daily.   Blood Pressure Monitor Misc For regular home bp monitoring during pregnancy   glucose blood test strip Use as instructed   ibuprofen 600 MG tablet Commonly known as: ADVIL Take 1 tablet (600 mg total) by mouth every 8  (eight) hours as needed for moderate pain or cramping.   omeprazole 20 MG capsule Commonly known as: PRILOSEC Take 1 capsule (20 mg total) by mouth daily. 1 tablet a day   PRENATAL VITAMIN PO Take 2 tablets by mouth daily. One a day brand   UNISOM PO Take by mouth.   vitamin B-6 25 MG tablet Commonly known as: pyridOXINE Take 25 mg by mouth daily.        Discharge home in stable condition Infant Feeding: Breast Infant Disposition:home with mother Discharge instruction: per After Visit Summary and Postpartum booklet. Activity: Advance as tolerated. Pelvic rest for 6 weeks.  Diet: routine diet Future Appointments: Future Appointments  Date Time Provider [place]  08/27/2020 11:30 AM Roma Schanz, CNM [facility]   Follow up Visit:  Follow-up Information    [facility]. Schedule an appointment as soon as possible for a visit on 07/26/2020.   Specialty: Obstetrics and Gynecology Why: Make appointment to be seen this week for BP check and 4 weeks for postpartum appointment  Contact information: [place] [phone]               Please schedule this patient for a In person postpartum visit in 4 weeks with the following provider: Any provider. Additional Postpartum F/U:2 hour GTT in 4-6 weeks and BP check 2-3 days  High risk pregnancy complicated by: GDM and HTN Delivery mode:  Vaginal, Spontaneous  Anticipated Birth Control:  Unsure   07/23/2020 Lajean Manes, CNM

## 2020-07-23 LAB — GLUCOSE, RANDOM: Glucose, Bld: 105 mg/dL — ABNORMAL HIGH (ref 70–99)

## 2020-07-23 MED ORDER — IBUPROFEN 600 MG PO TABS: 600.0000 mg | ORAL_TABLET | Freq: Three times a day (TID) | ORAL | 0 refills | Status: DC | PRN

## 2020-07-23 NOTE — Lactation Note (Signed)
This note was copied from a baby's chart. Lactation Consultation Note  Patient Name: Tiffany Dyer Today's Date: 07/23/2020 Reason for consult: Follow-up assessment;Early term 37-38.6wks  Follow up visit to 14 hours old infant with 3.58% weight loss of a P2 mother with breastfeeding experience. Baby is sleeping in mother's arms upon arrival. Mother states breastfeeding is going well but she feels baby is not getting enough. Mother explains she started bottle-feeding formula as a supplement. Last feeding baby breastfed for ~85minutes and bottlefed 20 mL of formula.    Reviewed with mother average size of a NB stomach and formula supplementation guidelines.  Reviewed importance to offer the breast 8 to 12 times in a 24-hour period for proper stimulation and to establish good milk supply. Discussed milk coming to volume. Promoted maternal rest, hydration and food intake. Reviewed newborn behavior and expectations with mother and encouraged to contact [facility] for support, questions or concerns.    All questions answered at this time.    Maternal Data Does the patient have breastfeeding experience prior to this delivery?: Yes  Feeding Feeding Type: Breast Fed  LATCH Score Latch: Grasps breast easily, tongue down, lips flanged, rhythmical sucking.  Audible Swallowing: A few with stimulation  Type of Nipple: Everted at rest and after stimulation  Comfort (Breast/Nipple): Soft / non-tender  Hold (Positioning): No assistance needed to correctly position infant at breast.  LATCH Score: 9  Interventions Interventions: Comfort gels;DEBP;Breast feeding basics reviewed  Lactation Tools Discussed/Used Tools: Pump Breast pump type: Double-Electric Breast Pump Pump Review: Milk Storage   Consult Status Consult Status: Follow-up Date: 07/24/20 Follow-up type: In-patient    Espn Zeman A Higuera Ancidey 07/23/2020, 11:55 AM

## 2020-07-23 NOTE — Plan of Care (Signed)
  Problem: Education: °Goal: Knowledge of condition will improve °Outcome: Completed/Met °Goal: Individualized Educational Video(s) °Outcome: Completed/Met °Goal: Individualized Newborn Educational Video(s) °Outcome: Completed/Met °  °Problem: Activity: °Goal: Will verbalize the importance of balancing activity with adequate rest periods °Outcome: Completed/Met °Goal: Ability to tolerate increased activity will improve °Outcome: Completed/Met °  °Problem: Coping: °Goal: Ability to identify and utilize available resources and services will improve °Outcome: Completed/Met °  °Problem: Life Cycle: °Goal: Chance of risk for complications during the postpartum period will decrease °Outcome: Completed/Met °  °Problem: Role Relationship: °Goal: Ability to demonstrate positive interaction with newborn will improve °Outcome: Completed/Met °  °Problem: Skin Integrity: °Goal: Demonstration of wound healing without infection will improve °Outcome: Completed/Met °  °

## 2020-07-23 NOTE — Progress Notes (Signed)
Discharge instructions reviewed with pt with verbalization of understanding.

## 2020-07-24 ENCOUNTER — Other Ambulatory Visit: Payer: Managed Care, Other (non HMO)

## 2020-07-27 ENCOUNTER — Other Ambulatory Visit: Payer: Managed Care, Other (non HMO) | Admitting: Obstetrics and Gynecology

## 2020-07-30 ENCOUNTER — Ambulatory Visit (INDEPENDENT_AMBULATORY_CARE_PROVIDER_SITE_OTHER): Payer: Managed Care, Other (non HMO) | Admitting: *Deleted

## 2020-07-30 VITALS — BP 157/87 | HR 54 | Ht 63.0 in | Wt 247.5 lb

## 2020-07-30 DIAGNOSIS — O165 Unspecified maternal hypertension, complicating the puerperium: Secondary | ICD-10-CM

## 2020-07-30 DIAGNOSIS — Z0130 Encounter for examination of blood pressure without abnormal findings: Secondary | ICD-10-CM

## 2020-07-30 MED ORDER — AMLODIPINE BESYLATE 5 MG PO TABS: 5.0000 mg | ORAL_TABLET | Freq: Every day | ORAL | 0 refills | Status: DC

## 2020-07-30 NOTE — Addendum Note (Signed)
Addended by: Cheral Marker on: 07/30/2020 04:15 PM   Modules accepted: Orders

## 2020-07-30 NOTE — Progress Notes (Addendum)
   NURSE VISIT- BLOOD PRESSURE CHECK  SUBJECTIVE:  Tiffany Dyer is a 34 y.o. G44P2012 female here for BP check. She is postpartum, delivery date 07/22/20    HYPERTENSION ROS:  Pregnant/postpartum:  . Severe headaches that don't go away with tylenol/other medicines: No  . Visual changes (seeing spots/double/blurred vision) No  . Severe pain under right breast breast or in center of upper chest No  . Severe nausea/vomiting No  . Taking medicines as instructed-will start med today   OBJECTIVE:  BP (!) 157/87 (BP Location: Right Arm, Patient Position: Sitting, Cuff Size: Large)   Pulse (!) 54   Ht 5\' 3"  (1.6 m)   Wt 247 lb 8 oz (112.3 kg)   LMP 10/29/2019   Breastfeeding Yes   BMI 43.84 kg/m   Appearance alert, well appearing, and in no distress.  ASSESSMENT: Postpartum  blood pressure check  PLAN: Discussed with 10/31/2019, CNM, [facility]   Recommendations: new prescription will be sent   Follow-up: on Wednesday afternoon for BP check   Thursday  07/30/2020 4:06 PM   Chart reviewed for nurse visit. Agree with plan of care. Rx norvasc 5mg , f/u 2d for bp check, take med 1hr prior 08/01/2020, 07/30/2020 4:14 PM

## 2020-07-30 NOTE — Progress Notes (Signed)
NST Interpretation: FHR reactive with normal baseline 135, accelerations >15 bpm, no decelerations.  Plan is to continue biweekly testing with NST's.

## 2020-08-01 ENCOUNTER — Encounter: Payer: Self-pay | Admitting: *Deleted

## 2020-08-01 ENCOUNTER — Ambulatory Visit (INDEPENDENT_AMBULATORY_CARE_PROVIDER_SITE_OTHER): Payer: Managed Care, Other (non HMO) | Admitting: *Deleted

## 2020-08-01 VITALS — BP 136/91 | HR 68 | Ht 63.0 in | Wt 242.5 lb

## 2020-08-01 DIAGNOSIS — Z0130 Encounter for examination of blood pressure without abnormal findings: Secondary | ICD-10-CM

## 2020-08-01 NOTE — Progress Notes (Addendum)
   NURSE VISIT- BLOOD PRESSURE CHECK  SUBJECTIVE:  Tiffany Dyer is a 34 y.o. G54P2012 female here for BP check. She is postpartum, delivery date 07/22/20    HYPERTENSION ROS:  Pregnant/postpartum:  . Severe headaches that don't go away with tylenol/other medicines: No  . Visual changes (seeing spots/double/blurred vision) No  . Severe pain under right breast breast or in center of upper chest No  . Severe nausea/vomiting No  . Taking medicines as instructed yes   OBJECTIVE:  BP (!) 136/91 (BP Location: Right Arm, Patient Position: Sitting, Cuff Size: Large)   Pulse 68   Ht 5\' 3"  (1.6 m)   Wt 242 lb 8 oz (110 kg)   Breastfeeding Yes   BMI 42.96 kg/m   Appearance alert, well appearing, and in no distress.  ASSESSMENT: Postpartum  blood pressure check  PLAN: Discussed with , CNM, [facility]   Recommendations: pt will increase Norvasc to 10 mg daily. Will take 2 of the 5 mg tabs. Pt has noticed a loss of appetite since coming home from hospital. Pt's pp depression screen was 2. Pt was advised to eat small frequent meals throughout the day instead of 3 big meals. Continue drinking plenty of water.    Follow-up: in 2 days for BP check with nurse.    [facility]  08/01/2020 3:19 PM   Chart reviewed for nurse visit. Agree with plan of care.  08/03/2020, Cheral Marker 08/01/2020 5:08 PM

## 2020-08-03 ENCOUNTER — Other Ambulatory Visit: Payer: Self-pay

## 2020-08-03 ENCOUNTER — Ambulatory Visit (INDEPENDENT_AMBULATORY_CARE_PROVIDER_SITE_OTHER): Payer: Managed Care, Other (non HMO) | Admitting: *Deleted

## 2020-08-03 DIAGNOSIS — Z8759 Personal history of other complications of pregnancy, childbirth and the puerperium: Secondary | ICD-10-CM

## 2020-08-03 NOTE — Progress Notes (Addendum)
   NURSE VISIT- BLOOD PRESSURE CHECK  SUBJECTIVE:  Tiffany Dyer is a 34 y.o. G81P2012 female here for BP check. She is postpartum, delivery date 07/22/20    HYPERTENSION ROS:  Pregnant/postpartum:  . Severe headaches that don't go away with tylenol/other medicines: No  . Visual changes (seeing spots/double/blurred vision) No  . Severe pain under right breast breast or in center of upper chest No  . Severe nausea/vomiting No  . Taking medicines as instructed yes  OBJECTIVE:  BP 131/84 (BP Location: Left Arm, Patient Position: Sitting, Cuff Size: Normal)   Pulse 79   Appearance alert, well appearing, and in no distress.  ASSESSMENT: Postpartum  blood pressure check  PLAN: Discussed with [name], CNM, [facility]   Recommendations: stop medicine 2 days before next visit   Follow-up: as scheduled   Annamarie Dawley  08/03/2020   11:47 AM   Chart reviewed for nurse visit. Agree with plan of care.  Cheral Marker, [place] 08/03/2020 5:58 PM

## 2020-08-08 ENCOUNTER — Other Ambulatory Visit: Payer: Self-pay

## 2020-08-08 ENCOUNTER — Other Ambulatory Visit: Payer: Self-pay | Admitting: Obstetrics & Gynecology

## 2020-08-08 ENCOUNTER — Other Ambulatory Visit (INDEPENDENT_AMBULATORY_CARE_PROVIDER_SITE_OTHER): Payer: Managed Care, Other (non HMO) | Admitting: *Deleted

## 2020-08-08 DIAGNOSIS — R8290 Unspecified abnormal findings in urine: Secondary | ICD-10-CM | POA: Diagnosis not present

## 2020-08-08 DIAGNOSIS — Z029 Encounter for administrative examinations, unspecified: Secondary | ICD-10-CM

## 2020-08-08 LAB — POCT URINALYSIS DIPSTICK OB
Glucose, UA: NEGATIVE
Nitrite, UA: NEGATIVE

## 2020-08-08 NOTE — Progress Notes (Signed)
   NURSE VISIT- UTI SYMPTOMS   SUBJECTIVE:  Tiffany Dyer is a 34 y.o. G36P2012 female here for UTI symptoms. She is postpartum. She reports cloudy urine and strange feeling at the end of urination. .  OBJECTIVE:  There were no vitals taken for this visit.  Appears well, in no apparent distress  Results for orders placed or performed in visit on 08/08/20 (from the past 24 hour(s))  POC Urinalysis Dipstick OB   Collection Time: 08/08/20  2:10 PM  Result Value Ref Range   Color, UA     Clarity, UA     Glucose, UA Negative Negative   Bilirubin, UA     Ketones, UA trace    Spec Grav, UA     Blood, UA large    pH, UA     POC,PROTEIN,UA Trace Negative, Trace, Small (1+), Moderate (2+), Large (3+), 4+   Urobilinogen, UA     Nitrite, UA neg    Leukocytes, UA Large (3+) (A) Negative   Appearance     Odor      ASSESSMENT: Postpartum with UTI symptoms and negative nitrites  PLAN: Discussed with Cyril Mourning, AGNP      Rx sent by provider today: No Urine culture sent Call or return to clinic prn if these symptoms worsen or fail to improve as anticipated. Follow-up: as scheduled   Annamarie Dawley  08/08/2020 2:11 PM

## 2020-08-08 NOTE — Progress Notes (Signed)
Chart reviewed for nurse visit. Agree with plan of care.  Adline Potter, NP 08/08/2020 2:38 PM

## 2020-08-11 LAB — URINE CULTURE

## 2020-08-14 ENCOUNTER — Other Ambulatory Visit: Payer: Self-pay | Admitting: Women's Health

## 2020-08-14 ENCOUNTER — Telehealth: Payer: Self-pay | Admitting: Women's Health

## 2020-08-14 MED ORDER — AMLODIPINE BESYLATE 10 MG PO TABS: 10.0000 mg | ORAL_TABLET | Freq: Every day | ORAL | 1 refills | Status: DC

## 2020-08-14 MED ORDER — AMOXICILLIN-POT CLAVULANATE 875-125 MG PO TABS: 1.0000 | ORAL_TABLET | Freq: Two times a day (BID) | ORAL | 0 refills | Status: DC

## 2020-08-14 NOTE — Telephone Encounter (Signed)
Patient called to see if Tiffany Dyer has sent in Rx for her BP meds, states it was supposed to be changed to a 10mg  & she's out of her BP meds  Also calling to check on urine analysis results, she sees in MyChart that results are back & wonders if she needs antibiotics

## 2020-08-15 ENCOUNTER — Telehealth: Payer: Self-pay | Admitting: Adult Health

## 2020-08-15 NOTE — Telephone Encounter (Signed)
Pt aware of urine and is taking Augmentin

## 2020-08-21 ENCOUNTER — Other Ambulatory Visit: Payer: Self-pay | Admitting: Women's Health

## 2020-08-27 ENCOUNTER — Encounter: Payer: Self-pay | Admitting: Women's Health

## 2020-08-27 ENCOUNTER — Ambulatory Visit (INDEPENDENT_AMBULATORY_CARE_PROVIDER_SITE_OTHER): Payer: Managed Care, Other (non HMO) | Admitting: Women's Health

## 2020-08-27 DIAGNOSIS — Z1332 Encounter for screening for maternal depression: Secondary | ICD-10-CM

## 2020-08-27 DIAGNOSIS — O165 Unspecified maternal hypertension, complicating the puerperium: Secondary | ICD-10-CM

## 2020-08-27 DIAGNOSIS — Z8759 Personal history of other complications of pregnancy, childbirth and the puerperium: Secondary | ICD-10-CM

## 2020-08-27 DIAGNOSIS — Z3009 Encounter for other general counseling and advice on contraception: Secondary | ICD-10-CM

## 2020-08-27 DIAGNOSIS — Z8632 Personal history of gestational diabetes: Secondary | ICD-10-CM

## 2020-08-27 MED ORDER — AMLODIPINE BESYLATE 10 MG PO TABS: 10.0000 mg | ORAL_TABLET | Freq: Every day | ORAL | 1 refills | Status: DC

## 2020-08-27 NOTE — Patient Instructions (Signed)
Stop blood pressure medicine 2 days before your visit in 3 weeks  You will have your sugar test in 7wks.  Please do not eat or drink anything after midnight the night before you come, not even water.  You will be here for at least two hours.  Please make an appointment online for the bloodwork at [email or web address] for 8:30am (or as close to this as possible). Make sure you select the [facility] service center. The day of the appointment, check in with our office first, then you will go to [facility] to start the sugar test.

## 2020-08-27 NOTE — Progress Notes (Signed)
POSTPARTUM VISIT Patient name: Tiffany Dyer MRN [id number]  Date of birth: 06-Dec-1985 Chief Complaint:   Postpartum Care  History of Present Illness:   Tiffany Dyer is a 34 y.o. G47P2012 African American female being seen today for a postpartum visit. She is 5 weeks postpartum following a spontaneous vaginal delivery at 38.1 gestational weeks. IOL: Yes, for GHTN and A2DM. Anesthesia: epidural.  Laceration: 1st degree.  Complications: none. Inpatient contraception: no.   Pregnancy complicated by [facility] and V5IE. Tobacco use: no. Substance use disorder: no. Last pap smear: 04/05/20 and results were normal. Next pap smear due: 2024 No LMP recorded. (Menstrual status: Lactating).  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 Started on norvasc 82m at 1wk bp check, then increased to 171m Stopped 3d ago as directed. Bleeding no bleeding. Bowel function is normal. Bladder function is normal. Urinary incontinence? No, fecal incontinence? No Patient is not sexually active. Last sexual activity: prior to birth of baby.  Desired contraception: husband had talked about vasectomy, pt not sure, she may possibly want another baby at some point. Has always used w/drawal in the past. Patient possibly wants a pregnancy in the future.    Upstream - 08/27/20 1140      Pregnancy Intention Screening   Does the patient want to become pregnant in the next year? No    Does the patient's partner want to become pregnant in the next year? No    Would the patient like to discuss contraceptive options today? No      Contraception Wrap Up   Current Method No Method - Other Reason          The pregnancy intention screening data noted above was reviewed. Potential methods of contraception were discussed. The patient elected to proceed with Withdrawal or Other Method.   Edinburgh Postpartum Depression Screening: negative  Edinburgh Postnatal Depression Scale - 08/27/20 1136      Edinburgh Postnatal  Depression Scale:  In the Past 7 Days   I have been able to laugh and see the funny side of things. 0    I have looked forward with enjoyment to things. 0    I have blamed myself unnecessarily when things went wrong. 1    I have been anxious or worried for no good reason. 1    I have felt scared or panicky for no good reason. 0    Things have been getting on top of me. 0    I have been so unhappy that I have had difficulty sleeping. 0    I have felt sad or miserable. 0    I have been so unhappy that I have been crying. 0    The thought of harming myself has occurred to me. 0    Edinburgh Postnatal Depression Scale Total 2          Baby's course has been uncomplicated. Baby is feeding by breast: milk supply adequate. Infant has a pediatrician/family doctor? Yes.  Childcare strategy if returning to work/school: family.  Pt has material needs met for her and baby: Yes.   Review of Systems:   Pertinent items are noted in HPI Denies Abnormal vaginal discharge w/ itching/odor/irritation, headaches, visual changes, shortness of breath, chest pain, abdominal pain, severe nausea/vomiting, or problems with urination or bowel movements. Pertinent History Reviewed:  Reviewed past medical,surgical, obstetrical and family history.  Reviewed problem list, medications and allergies. OB History  Gravida Para Term Preterm AB Living  3 2 2  1 2  SAB TAB Ectopic Multiple Live Births        0 2    # Outcome Date GA Lbr Len/2nd Weight Sex Delivery Anes PTL Lv  3 Term 07/22/20 33w1d09:35 / 00:05 6 lb 3.5 oz (2.821 kg) F Vag-Spont EPI  LIV  2 Term 10/10/17 368w2d5:30 / 00:34 7 lb 15.3 oz (3.609 kg) M Vag-Spont EPI N LIV     Birth Comments: WNL  1 AB 2008           Physical Assessment:   Vitals:   08/27/20 1135 08/27/20 1139  BP: (!) 139/96 (!) 150/98  Pulse: 82 85  Weight: 236 lb (107 kg)   Height: _0  (1.6 m)   Body mass index is 41.81 kg/m.       Physical Examination:   General  appearance: alert, well appearing, and in no distress  Mental status: alert, oriented to person, place, and time  Skin: warm & dry   Cardiovascular: normal heart rate noted   Respiratory: normal respiratory effort, no distress   Breasts: deferred, no complaints   Abdomen: soft, non-tender   Pelvic: VULVA: normal appearing vulva with no masses, tenderness or lesions  Rectal: no hemorrhoids  Extremities: no edema  Chaperone: AmCelene Squibb AnGlenard Haring     No results found for this or any previous visit (from the past 24 hour(s)).  Assessment & Plan:  1) Postpartum exam 2) 5 wks s/p spontaneous vaginal delivery after IOL for GHTN and A2DM 3) breast feeding 4) Depression screening 5) Contraception counseling>w/drawal, possible vasectomy. Discussed nexplanon vs IUD, will let usKoreanow 6) PPHTN> restart norvasc 1060maily, new rx sent, stop 2d before bp check visit w/ nurse in 3wks 7) A2DM> wants to do 2hr GTT @ 12wks, schedule today  Essential components of care per ACOG recommendations:  1.  Mood and well being:  . If positive depression screen, discussed and plan developed.  . If using tobacco we discussed reduction/cessation and risk of relapse . If current substance abuse, we discussed and referral to local resources was offered.   2. Infant care and feeding:  . If breastfeeding, discussed returning to work, pumping, breastfeeding-associated pain, guidance regarding return to fertility while lactating if not using another method. If needed, patient was provided with a letter to be allowed to pump q 2-3hrs to support lactation in a private location with access to a refrigerator to store breastmilk.   . Recommended that all caregivers be immunized for flu, pertussis and other preventable communicable diseases . If pt does not have material needs met for her/baby, referred to local resources for help obtaining these.  3. Sexuality, contraception and birth spacing . Provided guidance  regarding sexuality, management of dyspareunia, and resumption of intercourse . Discussed avoiding interpregnancy interval <6mt43mand recommended birth spacing of 18 months  4. Sleep and fatigue . Discussed coping options for fatigue and sleep disruption . Encouraged family/partner/community support of 4 hrs of uninterrupted sleep to help with mood and fatigue  5. Physical recovery  . If pt had a C/S, assessed incisional pain and providing guidance on normal vs prolonged recovery . If pt had a laceration, perineal healing and pain reviewed.  . If urinary or fecal incontinence, discussed management and referred to PT or uro/gyn if indicated  . Patient is safe to resume physical activity. Discussed attainment of healthy weight.  6.  Chronic disease management . Discussed pregnancy complications if any, and their  implications for future childbearing and long-term maternal health. . Review recommendations for prevention of recurrent pregnancy complications, such as 17 hydroxyprogesterone caproate to reduce risk for recurrent PTB not applicable, or aspirin to reduce risk of preeclampsia yes. . Pt had GDM: Yes. If yes, 2hr GTT scheduled: yes. . Reviewed medications and non-pregnant dosing including consideration of whether pt is breastfeeding using a reliable resource such as LactMed: yes . Referred for f/u w/ PCP or subspecialist providers as indicated: not applicable  7. Health maintenance . Mammogram at 35yo or earlier if indicated . Pap smears as indicated  Meds:  Meds ordered this encounter  Medications  . amLODipine (NORVASC) 10 MG tablet    Sig: Take 1 tablet (10 mg total) by mouth daily.    Dispense:  30 tablet    Refill:  1    Order Specific Question:   Supervising Provider    Answer:   Florian Buff [[id number]]    Follow-up: Return in about 3 weeks (around 09/17/2020) for bp check w/ nurse, then 7wks from now for 2hr sugar test (no visit).   No orders of the defined types were  placed in this encounter.   [place], [facility] 08/27/2020 12:56 PM

## 2020-09-05 ENCOUNTER — Other Ambulatory Visit: Payer: Self-pay | Admitting: Women's Health

## 2020-09-18 ENCOUNTER — Other Ambulatory Visit: Payer: Self-pay

## 2020-09-18 ENCOUNTER — Ambulatory Visit (INDEPENDENT_AMBULATORY_CARE_PROVIDER_SITE_OTHER): Payer: Managed Care, Other (non HMO) | Admitting: *Deleted

## 2020-09-18 VITALS — BP 131/83 | HR 84 | Wt 232.8 lb

## 2020-09-18 DIAGNOSIS — R300 Dysuria: Secondary | ICD-10-CM | POA: Diagnosis not present

## 2020-09-18 DIAGNOSIS — R350 Frequency of micturition: Secondary | ICD-10-CM | POA: Diagnosis not present

## 2020-09-18 DIAGNOSIS — R399 Unspecified symptoms and signs involving the genitourinary system: Secondary | ICD-10-CM

## 2020-09-18 NOTE — Progress Notes (Signed)
Pt states that when she experienced UTI symptoms on 10/27, she contacted this office through MyChart. She went to Urgent care in [place], [place] and had urine tested there, showing a UTI.

## 2020-09-19 LAB — POCT URINALYSIS DIPSTICK OB
Blood, UA: NEGATIVE
Glucose, UA: NEGATIVE
Ketones, UA: NEGATIVE
Leukocytes, UA: NEGATIVE
Nitrite, UA: NEGATIVE
POC,PROTEIN,UA: NEGATIVE

## 2020-09-19 NOTE — Progress Notes (Addendum)
   NURSE VISIT- BLOOD PRESSURE CHECK  SUBJECTIVE:  Tiffany Dyer is a 34 y.o. G19P2012 female here for BP check. She is postpartum, delivery date 07/22/20    HYPERTENSION ROS:  Pregnant/postpartum:  . Severe headaches that don't go away with tylenol/other medicines: No  . Visual changes (seeing spots/double/blurred vision) No  . Severe pain under right breast breast or in center of upper chest No  . Severe nausea/vomiting No  . Taking medicines as instructed yes  OBJECTIVE:  BP 131/83 (BP Location: Right Arm, Patient Position: Sitting, Cuff Size: Large)   Pulse 84   Wt 232 lb 12.8 oz (105.6 kg)   LMP 09/03/2020 (Approximate)   Breastfeeding Yes   BMI 41.24 kg/m   Appearance alert, well appearing, and in no distress and oriented to person, place, and time.  ASSESSMENT: Postpartum  blood pressure check  PLAN: Discussed with [name], CNM, [name]   Recommendations: no changes needed   Follow-up: in 4 weeks   [name]  09/19/2020 9:05 AM     NURSE VISIT- UTI SYMPTOMS   SUBJECTIVE:  Tiffany Dyer is a 34 y.o. [id number] female here for UTI symptoms. She is postpartum. She reports dysuria, urinary frequency and urinary hesitancy.  OBJECTIVE:  BP 131/83 (BP Location: Right Arm, Patient Position: Sitting, Cuff Size: Large)   Pulse 84   Wt 232 lb 12.8 oz (105.6 kg)   LMP 09/03/2020 (Approximate)   Breastfeeding Yes   BMI 41.24 kg/m   Appears well, in no apparent distress  Results for orders placed or performed in visit on 09/18/20 (from the past 24 hour(s))  POC Urinalysis Dipstick OB   Collection Time: 09/19/20  9:03 AM  Result Value Ref Range   Color, UA     Clarity, UA     Glucose, UA Negative Negative   Bilirubin, UA     Ketones, UA neg    Spec Grav, UA     Blood, UA neg    pH, UA     POC,PROTEIN,UA Negative Negative, Trace, Small (1+), Moderate (2+), Large (3+), 4+   Urobilinogen, UA     Nitrite, UA neg    Leukocytes, UA Negative Negative    Appearance     Odor      ASSESSMENT: Postpartum with UTI symptoms and negative nitrites  PLAN: Discussed with [name], CNM, [name]   Rx sent by provider today: No Urine culture sent Call or return to clinic prn if these symptoms worsen or fail to improve as anticipated. Follow-up: as scheduled   [name]  09/19/2020 9:08 AM   Chart reviewed for nurse visit. Agree with plan of care.  Cheral Marker, [place] 09/24/2020 1:13 PM

## 2020-09-21 LAB — URINE CULTURE

## 2020-09-23 ENCOUNTER — Other Ambulatory Visit: Payer: Self-pay | Admitting: Obstetrics & Gynecology

## 2020-09-24 ENCOUNTER — Other Ambulatory Visit: Payer: Self-pay | Admitting: Women's Health

## 2020-09-24 MED ORDER — SULFAMETHOXAZOLE-TRIMETHOPRIM 800-160 MG PO TABS: 1.0000 | ORAL_TABLET | Freq: Two times a day (BID) | ORAL | 0 refills | Status: DC

## 2020-10-01 ENCOUNTER — Other Ambulatory Visit: Payer: Self-pay | Admitting: Women's Health

## 2020-10-02 ENCOUNTER — Other Ambulatory Visit: Payer: Self-pay

## 2020-10-02 ENCOUNTER — Other Ambulatory Visit (INDEPENDENT_AMBULATORY_CARE_PROVIDER_SITE_OTHER): Payer: Managed Care, Other (non HMO) | Admitting: *Deleted

## 2020-10-02 VITALS — BP 127/80 | HR 90 | Ht 63.0 in | Wt 232.8 lb

## 2020-10-02 DIAGNOSIS — Z87440 Personal history of urinary (tract) infections: Secondary | ICD-10-CM

## 2020-10-02 LAB — POCT URINALYSIS DIPSTICK
Glucose, UA: NEGATIVE
Ketones, UA: NEGATIVE
Nitrite, UA: NEGATIVE
Protein, UA: NEGATIVE

## 2020-10-02 MED ORDER — FLUCONAZOLE 150 MG PO TABS: 150.0000 mg | ORAL_TABLET | Freq: Once | ORAL | 0 refills | Status: AC

## 2020-10-02 NOTE — Progress Notes (Addendum)
   NURSE VISIT- UTI SYMPTOMS   SUBJECTIVE:  Tiffany Dyer is a 34 y.o. G78P2012 female here for UTI symptoms. She is a GYN patient. She reports follow up on UTI.  OBJECTIVE:  BP 127/80 (BP Location: Left Arm, Patient Position: Sitting, Cuff Size: Large)   Pulse 90   Ht 5\' 3"  (1.6 m)   Wt 232 lb 12.8 oz (105.6 kg)   LMP 09/03/2020 (Approximate)   Breastfeeding Yes   BMI 41.24 kg/m   Appears well, in no apparent distress  Results for orders placed or performed in visit on 10/02/20 (from the past 24 hour(s))  POCT Urinalysis Dipstick   Collection Time: 10/02/20 10:28 AM  Result Value Ref Range   Color, UA     Clarity, UA     Glucose, UA Negative Negative   Bilirubin, UA     Ketones, UA neg    Spec Grav, UA     Blood, UA trace    pH, UA     Protein, UA Negative Negative   Urobilinogen, UA     Nitrite, UA neg    Leukocytes, UA Trace (A) Negative   Appearance     Odor      ASSESSMENT: Postpartum for follow up on UTI and negative nitrites  PLAN: Note routed to 10/04/20, CNM, [facility]   Rx sent by provider today: No Urine culture sent Call or return to clinic prn if these symptoms worsen or fail to improve as anticipated. Follow-up: as needed   [facility]  10/02/2020 10:45 AM   Chart reviewed for nurse visit. Agree with plan of care. Rx diflucan. 10/04/2020, Cheral Marker 10/02/2020 12:17 PM

## 2020-10-02 NOTE — Addendum Note (Signed)
Addended by: Shawna Clamp R on: 10/02/2020 12:17 PM   Modules accepted: Orders

## 2020-10-04 LAB — URINE CULTURE

## 2020-10-07 ENCOUNTER — Other Ambulatory Visit: Payer: Self-pay | Admitting: Women's Health

## 2020-10-07 MED ORDER — NITROFURANTOIN MONOHYD MACRO 100 MG PO CAPS: 100.0000 mg | ORAL_CAPSULE | Freq: Two times a day (BID) | ORAL | 0 refills | Status: DC

## 2020-10-08 ENCOUNTER — Other Ambulatory Visit: Payer: Self-pay | Admitting: Women's Health

## 2020-10-08 DIAGNOSIS — N390 Urinary tract infection, site not specified: Secondary | ICD-10-CM

## 2020-10-08 NOTE — Addendum Note (Signed)
Addended by: Cheral Marker on: 10/08/2020 08:48 AM   Modules accepted: Orders

## 2020-10-12 ENCOUNTER — Ambulatory Visit (HOSPITAL_COMMUNITY)
Admission: RE | Admit: 2020-10-12 | Discharge: 2020-10-12 | Disposition: A | Discharge status: Home / Self Care | Payer: Managed Care, Other (non HMO) | Source: Ambulatory Visit | Attending: Women's Health | Admitting: Women's Health

## 2020-10-12 ENCOUNTER — Other Ambulatory Visit: Payer: Self-pay

## 2020-10-12 DIAGNOSIS — N390 Urinary tract infection, site not specified: Secondary | ICD-10-CM | POA: Insufficient documentation

## 2020-10-12 NOTE — Progress Notes (Signed)
[facility] New Patient Evaluation and Consultation  Referring Provider: Cheral Dyer, CNM PCP: Patient, No Pcp Per Date of Service: 10/17/2020  SUBJECTIVE Chief Complaint: New Patient (Initial Visit) Tiffany Dyer, CNM referral for frequent UTIs)  History of Present Illness: Tiffany Dyer is a 34 y.o. Black or African-American female seen in consultation at the request of Tiffany Dyer for evaluation of recurrent urinary tract infection.    Review of records significant for: Urine cultures:  08/08/20- >100,000 Proteus mirabilis, treated with augmentin 09/18/20- 50-100,000 Proteus mirabilis, treated with bactrim 10/02/20- 10-25,000 staph epidermidis, treated with macrobid  Scheduled for renal ultrasound on 10/12/20  Recently had NSVD on 07/22/20, uncomplicated delivery with 1st degree perineal laceration  Urinary Symptoms: Does not leak urine.   Day time voids - normal amount, every few hours.  Nocturia: 2 times per night to void. Voiding dysfunction: she empties her bladder well.  does not use a catheter to empty bladder.  When urinating, she feels she has no difficulties  UTIs: 3 UTI's in the last year. Also went to urgent care at the end of October as well.  Symptoms with UTI- cloudy urine and pressure with voiding near the urethra. Did not have symptoms with the culture done on 10/02/20. Denies history of blood in urine and kidney or bladder stones  Pelvic Organ Prolapse Symptoms:                  She Denies a feeling of a bulge the vaginal area.  Bowel Symptom: Bowel movements: 1-2 time(s) per day Stool consistency: soft  Straining: no.  Splinting: no.  Incomplete evacuation: no.  She Denies accidental bowel leakage / fecal incontinence Bowel regimen: none Last colonoscopy: n/a  Sexual Function Sexually active: no- not currently since childbirth Sexual orientation: heterosexual Pain with sex: No  Pelvic Pain Denies pelvic pain  Past  Medical History:  Past Medical History:  Diagnosis Date   Genital herpes 2008   Gestational diabetes    Pregnancy induced hypertension      Past Surgical History:   Past Surgical History:  Procedure Laterality Date   CHOLECYSTECTOMY     INDUCED ABORTION     WISDOM TOOTH EXTRACTION       Past OB/GYN History: G3 P3 Vaginal deliveries: 1,  Forceps/ Vacuum deliveries: 1, Cesarean section: 1 Menopausal: No, LMP Patient's last menstrual period was 09/03/2020. Contraception: none. Last pap smear was 2021- negative.  Any history of abnormal pap smears: no. Currently breastfeeding  Medications: She has a current medication list which includes the following prescription(s): nitrofurantoin (macrocrystal-monohydrate), prenatal vit-fe fumarate-fa, [START ON 10/18/2020] estradiol, and nitrofurantoin (macrocrystal-monohydrate).   Allergies: Patient has No Known Allergies.   Social History:  Social History   Tobacco Use   Smoking status: Never Smoker   Smokeless tobacco: Never Used  Building services engineer Use: Never used  Substance Use Topics   Alcohol use: No   Drug use: No    Relationship status: married She lives with husband and children.   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 Regular exercise: No History of abuse: No  Family History:   Family History  Problem Relation Age of Onset   Other Paternal Grandfather        poor circulation   Thyroid disease Paternal Grandfather    Diabetes Paternal Grandmother    Hypertension Maternal Grandmother    Cancer Maternal Grandfather        kidney   Diabetes Father  Cancer Paternal Aunt        breast   Cancer Maternal Aunt        breast      Review of Systems: Review of Systems  Constitutional: Negative for fever, malaise/fatigue and weight loss.  Respiratory: Negative for cough, shortness of breath and wheezing.   Cardiovascular: Negative for chest pain, palpitations and leg swelling.  Gastrointestinal:  Negative for abdominal pain and blood in stool.  Genitourinary: Negative for dysuria.  Musculoskeletal: Negative for myalgias.  Skin: Negative for rash.  Neurological: Negative for dizziness and headaches.  Endo/Heme/Allergies: Does not bruise/bleed easily.  Psychiatric/Behavioral: Negative for depression. The patient is not nervous/anxious.      OBJECTIVE Physical Exam: Vitals:   10/17/20 1104  BP: 132/86  Pulse: 86  Weight: 235 lb 8 oz (106.8 kg)  Height: 5\' 2"  (1.575 m)    Physical Exam Constitutional:      General: She is not in acute distress. Pulmonary:     Effort: Pulmonary effort is normal.  Abdominal:     General: There is no distension.     Palpations: Abdomen is soft.     Tenderness: There is no abdominal tenderness. There is no rebound.  Musculoskeletal:        General: No swelling. Normal range of motion.  Skin:    General: Skin is warm and dry.     Findings: No rash.  Neurological:     Mental Status: She is alert and oriented to person, place, and time.  Psychiatric:        Mood and Affect: Mood normal.        Behavior: Behavior normal.     GU / Detailed Urogynecologic Evaluation:  Pelvic Exam: Normal external female genitalia; Bartholin's and Skene's glands normal in appearance; urethral meatus normal in appearance, no urethral masses or discharge.   CST: negative  Speculum exam reveals normal vaginal mucosa without atrophy. Cervix normal appearance. Uterus normal single, nontender. Adnexa normal adnexa.    Pelvic floor strength III/V  Pelvic floor musculature: Right levator non-tender, Right obturator non-tender, Left levator non-tender, Left obturator non-tender  POP-Q:  Deferred, no prolapse  Rectal Exam:  Normal external rectum  Post-Void Residual (PVR) by Bladder Scan: In order to evaluate bladder emptying, we discussed obtaining a postvoid residual and she agreed to this procedure.  Procedure: The ultrasound unit was placed on the  patient's abdomen in the suprapubic region after the patient had voided. A PVR of 31 ml was obtained by bladder scan.  Laboratory Results: POC urine: negative for all components  I visualized the urine specimen, noting the specimen to be clear yellow    Renal Ultrasound 10/12/20 IMPRESSION: Normal renal ultrasound. No hydronephrosis or other significant finding.  ASSESSMENT AND PLAN Ms. Tatsch is a 34 y.o. with:  1. Recurrent UTI   2. Urinary frequency   3. Vaginal dryness    1. Recurrent UTI/ Vaginal dryness - For treatment of recurrent urinary tract infections, we discussed management of recurrent UTIs including prophylaxis with a daily low dose antibiotic, transvaginal estrogen therapy, D-mannose, and cranberry supplements.   - She is currently breastfeeding, which could affect vaginal estrogen. She is interested in using vaginal estrogen- Estrace 0.5mg  twice a week prescribed. She should place in the vagina and around the urethra. - Also will start daily antibiotic prophylaxis. Prescribed macrobid 100mg  daily. She should only pursue treatment of infection if she has symptoms.    2. Frequency - no sign of UTI today,  POC urine negative     Return 5-6 months for follow up   Marguerita Beards, MD   Medical Decision Making:  - Reviewed/ ordered a clinical laboratory test - Review and summation of prior records - Independent review of urine specimen

## 2020-10-15 ENCOUNTER — Other Ambulatory Visit: Payer: Managed Care, Other (non HMO)

## 2020-10-17 ENCOUNTER — Ambulatory Visit (INDEPENDENT_AMBULATORY_CARE_PROVIDER_SITE_OTHER): Payer: Managed Care, Other (non HMO) | Admitting: Obstetrics and Gynecology

## 2020-10-17 ENCOUNTER — Encounter: Payer: Self-pay | Admitting: Obstetrics and Gynecology

## 2020-10-17 ENCOUNTER — Other Ambulatory Visit: Payer: Self-pay

## 2020-10-17 VITALS — BP 132/86 | HR 86 | Ht 62.0 in | Wt 235.5 lb

## 2020-10-17 DIAGNOSIS — N898 Other specified noninflammatory disorders of vagina: Secondary | ICD-10-CM | POA: Diagnosis not present

## 2020-10-17 DIAGNOSIS — N390 Urinary tract infection, site not specified: Secondary | ICD-10-CM

## 2020-10-17 DIAGNOSIS — R350 Frequency of micturition: Secondary | ICD-10-CM

## 2020-10-17 LAB — POCT URINALYSIS DIPSTICK
Appearance: NORMAL
Bilirubin, UA: NEGATIVE
Blood, UA: NEGATIVE
Glucose, UA: NEGATIVE
Ketones, UA: NEGATIVE
Leukocytes, UA: NEGATIVE
Nitrite, UA: NEGATIVE
Protein, UA: NEGATIVE
Spec Grav, UA: 1.01 (ref 1.010–1.025)
Urobilinogen, UA: 0.2 E.U./dL
pH, UA: 6 (ref 5.0–8.0)

## 2020-10-17 MED ORDER — ESTRADIOL 0.1 MG/GM VA CREA: 0.5000 g | TOPICAL_CREAM | VAGINAL | 11 refills | Status: DC

## 2020-10-17 MED ORDER — NITROFURANTOIN MONOHYD MACRO 100 MG PO CAPS: 100.0000 mg | ORAL_CAPSULE | Freq: Every day | ORAL | 5 refills | Status: DC

## 2020-10-17 NOTE — Patient Instructions (Signed)
Start vaginal estrogen therapy nightly for two weeks then 2 times weekly at night for treatment of vaginal atrophy (dryness of the vaginal tissues).  Please let us know if the prescription is too expensive and we can look for alternative options.    Start antibiotic once a day for 6 months for the recurrent UTI.

## 2020-12-04 HISTORY — PX: ANKLE FRACTURE SURGERY: SHX122

## 2021-03-07 ENCOUNTER — Ambulatory Visit (INDEPENDENT_AMBULATORY_CARE_PROVIDER_SITE_OTHER): Payer: Managed Care, Other (non HMO) | Admitting: Obstetrics and Gynecology

## 2021-03-07 ENCOUNTER — Other Ambulatory Visit: Payer: Self-pay

## 2021-03-07 ENCOUNTER — Encounter: Payer: Self-pay | Admitting: Obstetrics and Gynecology

## 2021-03-07 VITALS — BP 133/81 | HR 76 | Ht 63.0 in | Wt 235.0 lb

## 2021-03-07 DIAGNOSIS — N390 Urinary tract infection, site not specified: Secondary | ICD-10-CM

## 2021-03-07 DIAGNOSIS — N898 Other specified noninflammatory disorders of vagina: Secondary | ICD-10-CM | POA: Diagnosis not present

## 2021-03-07 DIAGNOSIS — R300 Dysuria: Secondary | ICD-10-CM

## 2021-03-07 NOTE — Progress Notes (Signed)
[facility] Return Visit  SUBJECTIVE  History of Present Illness: Tiffany Dyer is a 35 y.o. female seen in follow-up for recurrent UTI and vaginal dryness. Plan at last visit was to start vaginal estrogen as she had been breastfeeding and to start antibiotic prophylaxis with macrobid.   She has not had any urinary tract infections since starting the macrobid.   Sometimes has more burning with urination if she holds her urine for too long, but this goes away quickly. Drinking mostly water and 1 soda during the day. Will notice the burning if she is drinking soda, but does not notice it if she is only drinking water.   Did not use the vaginal estrogen. Still having some vaginal dryness.   Past Medical History: Patient  has a past medical history of Genital herpes (2008), Gestational diabetes, and Pregnancy induced hypertension.   Past Surgical History: She  has a past surgical history that includes Wisdom tooth extraction; Induced abortion; and Cholecystectomy.   Medications: She has a current medication list which includes the following prescription(s): nitrofurantoin (macrocrystal-monohydrate), prenatal vit-fe fumarate-fa, and estradiol.   Allergies: Patient has No Known Allergies.   Social History: Patient  reports that she has never smoked. She has never used smokeless tobacco. She reports that she does not drink alcohol and does not use drugs.      OBJECTIVE     Physical Exam: Vitals:   03/07/21 1342  BP: 133/81  Pulse: 76  Weight: 235 lb (106.6 kg)  Height: 5\' 3"  (1.6 m)   Gen: No apparent distress, A&O x 3.  Detailed Urogynecologic Evaluation:  Deferred.    ASSESSMENT AND PLAN    Ms. Grout is a 35 y.o. with:  1. Recurrent UTI   2. Dysuria   3. Vaginal dryness    1. rUTI - She has one month left of the prophylactic antibiotics. After that time can discontinue. Advised to start OTC supplements cranberry and D-mannose which can help  prevent infections.   2. Dysuria - Appears to be related to certain beverages. Provided with list of bladder irritants and discussed avoiding soda or tea packets.  - Can also take Prelief, Tums or Azo over the counter if needed  3. Vaginal dryness - Start using the vaginal estrogen 0.5g twice a week in the vagina. She should continue this while she is breastfeeding and can discontinue once she completes breastfeeding as hormones will likely return to normal.   Follow up as needed.   31, MD  Time spent: I spent 20 minutes dedicated to the care of this patient on the date of this encounter to include pre-visit review of records, face-to-face time with the patient and post visit documentation.

## 2021-03-07 NOTE — Progress Notes (Signed)
 Tiffany Dyer is a 35 y.o. female here for a medication f/u. Pt says she has burning in the beginning of urination but it goes away.

## 2021-03-07 NOTE — Patient Instructions (Addendum)
Today we talked about ways to manage bladder urgency such as altering your diet to avoid irritative beverages and foods (bladder diet) as well as attempting to decrease stress and other exacerbating factors.  You can also chew a plain Tums 1-3 times per day or Prelief to make your urine less acidic, especially if you have eating/drinking acidic things. You can also take Azo over the counter.    The Most Bothersome Foods* The Least Bothersome Foods*  Coffee - Regular & Decaf Tea - caffeinated Carbonated beverages - cola, non-colas, diet & caffeine-free Alcohols - Beer, Red Wine, White Wine, [address] - Grapefruit, [place], Orange, [employer] - Cranberry, Grapefruit, Orange, Pineapple Vegetables - Tomato & Tomato Products Flavor Enhancers - Hot peppers, Spicy foods, Chili, Horseradish, Vinegar, Monosodium glutamate (MSG) Artificial Sweeteners - NutraSweet, Sweet 'N Low, Equal (sweetener), Saccharin Ethnic foods - [place], [place], [place] food [employer] - low-fat & whole Fruits - Bananas, Blueberries, Honeydew melon, Pears, Raisins, Watermelon Vegetables - Broccoli, [address] Sprouts, [place], Carrots, Cauliflower, [place], Cucumber, Mushrooms, Peas, Radishes, Squash, Zucchini, White potatoes, Sweet potatoes & yams Poultry - Chicken, Eggs, [place], [employer] - Beef, Dipl[employer] operational officer, Lamb Seafood - Shrimp, [place] fish, Salmon Grains - Oat, Rice Snacks - Pretzels, Popcorn  *Lenward Chancellor et al. Diet and its role in interstitial cystitis/bladder pain syndrome (IC/BPS) and comorbid conditions. [employer]. BJU Int. 2012 Jan 11.     Try 0.5g twice a week of vaginal estrogen, place in the vagina.   To prevent infections after finishing antibiotics, start cranberry tablets and D-mannose supplement.

## 2021-07-31 ENCOUNTER — Encounter: Payer: Self-pay | Admitting: Obstetrics and Gynecology

## 2022-10-16 ENCOUNTER — Telehealth: Payer: Managed Care, Other (non HMO) | Admitting: Family Medicine

## 2022-10-16 DIAGNOSIS — N939 Abnormal uterine and vaginal bleeding, unspecified: Secondary | ICD-10-CM

## 2022-10-16 NOTE — Progress Notes (Signed)
Tiffany Dyer   Will be following up in person at local [facility] -due to vaginal bleeding and pain with intercourse new onset.   Patient acknowledged agreement and understanding of the plan.

## 2022-10-21 ENCOUNTER — Encounter: Payer: Self-pay | Admitting: Adult Health

## 2022-10-21 ENCOUNTER — Ambulatory Visit (INDEPENDENT_AMBULATORY_CARE_PROVIDER_SITE_OTHER): Payer: Managed Care, Other (non HMO) | Admitting: Adult Health

## 2022-10-21 VITALS — BP 130/83 | HR 77 | Ht 63.0 in | Wt 203.5 lb

## 2022-10-21 DIAGNOSIS — N930 Postcoital and contact bleeding: Secondary | ICD-10-CM | POA: Insufficient documentation

## 2022-10-21 DIAGNOSIS — R102 Pelvic and perineal pain: Secondary | ICD-10-CM

## 2022-10-21 NOTE — Progress Notes (Signed)
  Subjective:     Patient ID: Tiffany Dyer, female   DOB: 1986/10/27, 36 y.o.   MRN: [id number]  HPI Doryce is a 36 year old black female,married, [id number], in complaining of bleeding after sex last week, is dark now, had cramping for 2 days.  Last pap was 04/05/20, negative HPV and malignancy.  Review of Systems +bleeding after sex for 1 week, was more deep sex Had cramping for 2 days, none now Has ?pull low back to see orthopedist    Reviewed past medical,surgical, social and family history. Reviewed medications and allergies.   Objective:   Physical Exam BP 130/83 (BP Location: Right Arm, Patient Position: Sitting, Cuff Size: Large)   Pulse 77   Ht 5\' 3"  (1.6 m)   Wt 203 lb 8 oz (92.3 kg)   LMP 10/05/2022 (Approximate)   Breastfeeding No   BMI 36.05 kg/m     Skin warm and dry.Pelvic: external genitalia is normal in appearance no lesions, vagina: dark blood without odor,urethra has no lesions or masses noted, cervix:smooth and bulbous, no CMT,uterus: normal size, shape and contour, non tender, no masses felt, adnexa: no masses or tenderness noted. Bladder is non tender and no masses felt.  Fall risk is low  [facility] - 10/21/22 1154       Pregnancy Intention Screening   Does the patient want to become pregnant in the next year? Ok Either Way    Does the patient's partner want to become pregnant in the next year? Ok Either Way    Would the patient like to discuss contraceptive options today? No      Contraception Wrap Up   Current Method No Method - Other Reason    Reason for No Current Contraceptive Method at Intake ([facility] Only) Seeking Pregnancy    End Method No Method - Other Reason            Examination chaperoned by 10/23/22 LPN  Assessment:     1. PCB (post coital bleeding) Had sex last week and bleeding started next day and last a week, is dark now  If persists let me know  2. Pelvic cramping Resolved after 2 days     Plan:     Return in 6 months  for pap and physical

## 2022-12-06 IMAGING — US US RENAL
1 series · 14 of 25 positions shown · non-contrast
Comparison: None.

CLINICAL DATA: Initial evaluation for frequent UTIs.

EXAM:
RENAL / URINARY TRACT ULTRASOUND COMPLETE

[Series 1: us renal · 14 of 43 slices shown]
[im 1/43]
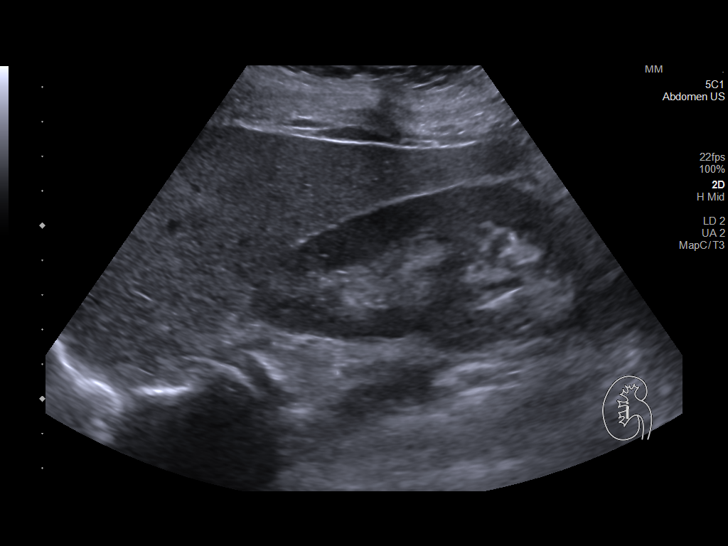
[im 4/43]
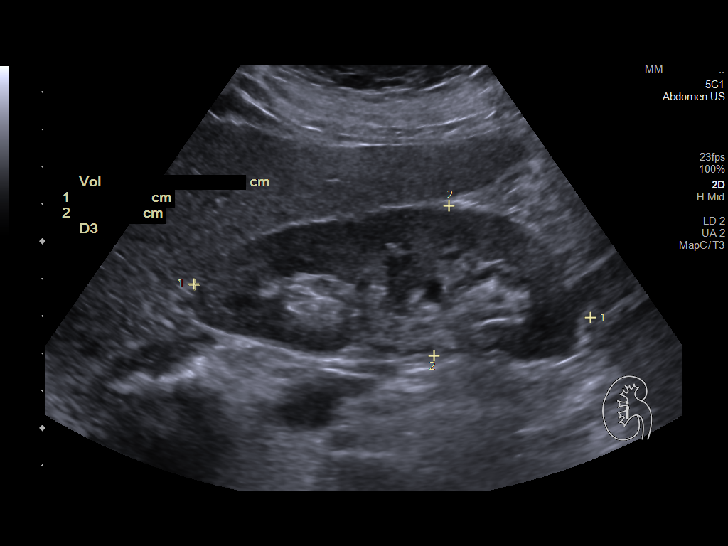
[im 8/43]
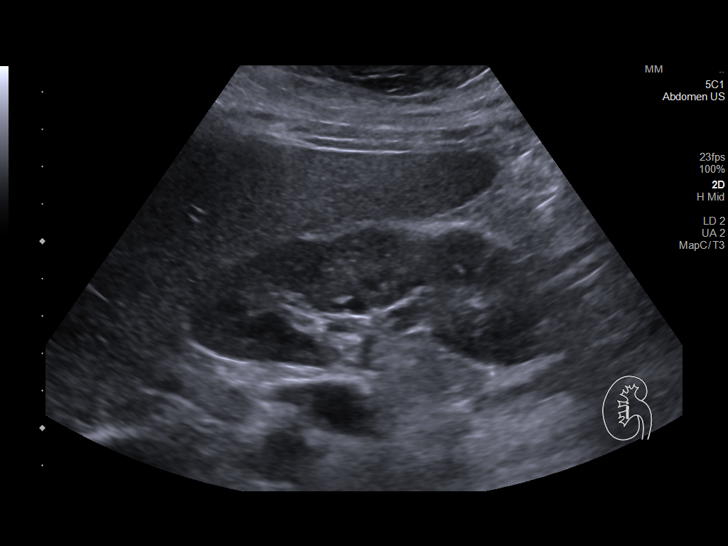
[im 11/43]
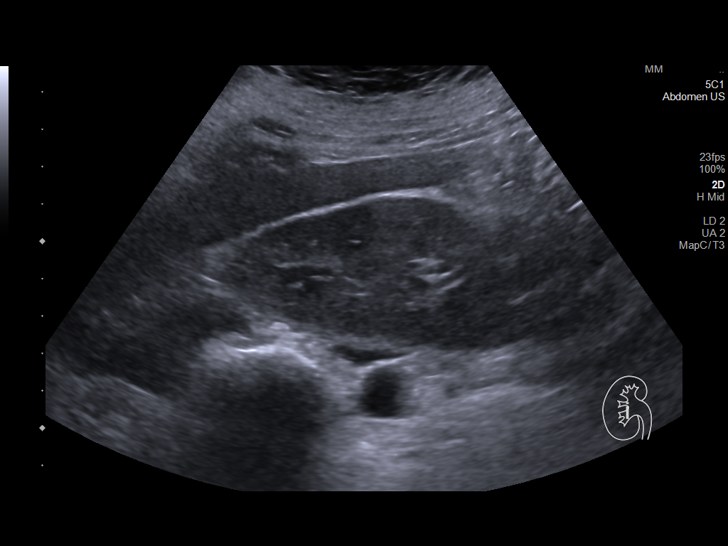
[im 15/43]
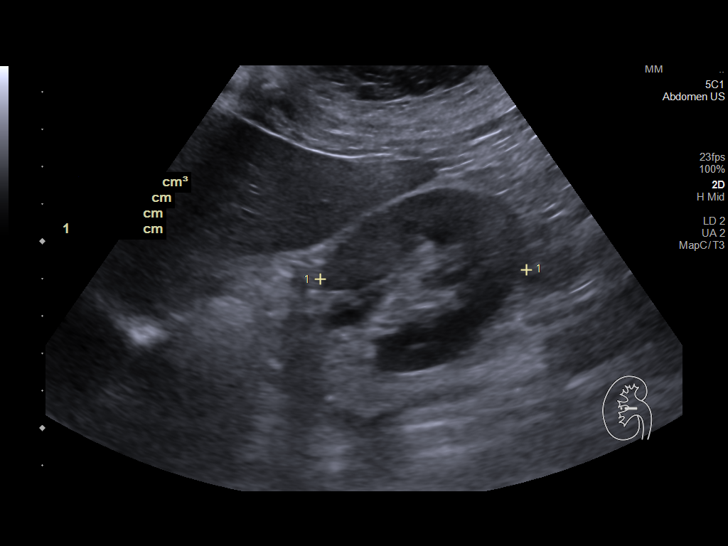
[im 16/43]
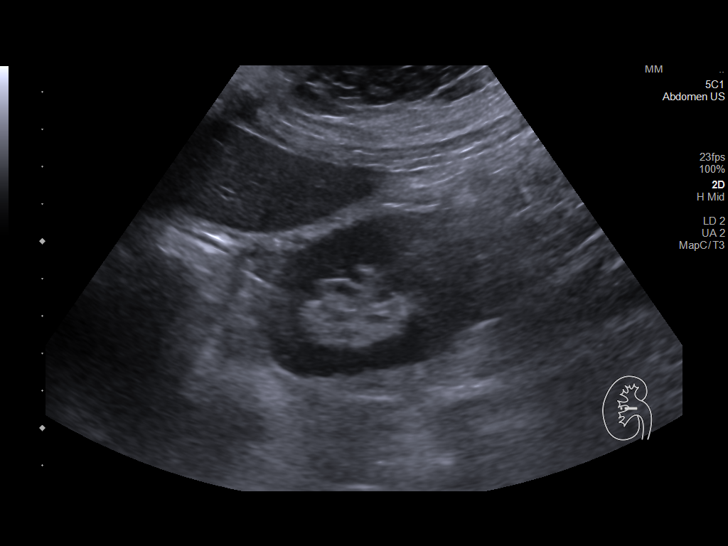
[im 20/43]
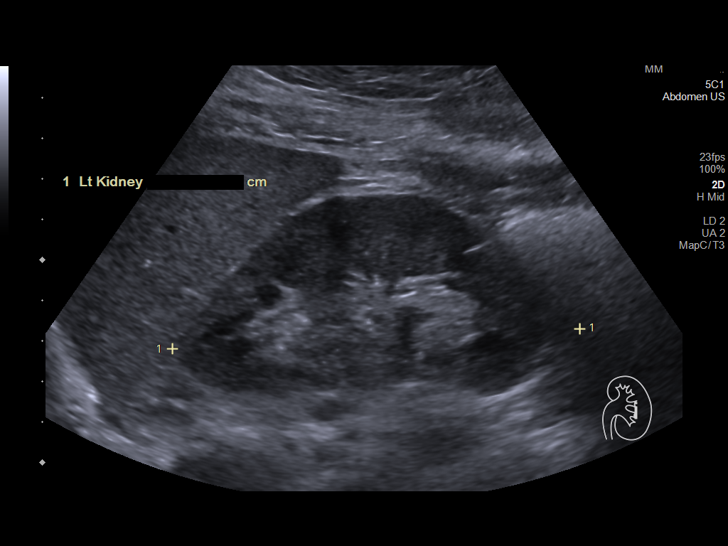
[im 23/43]
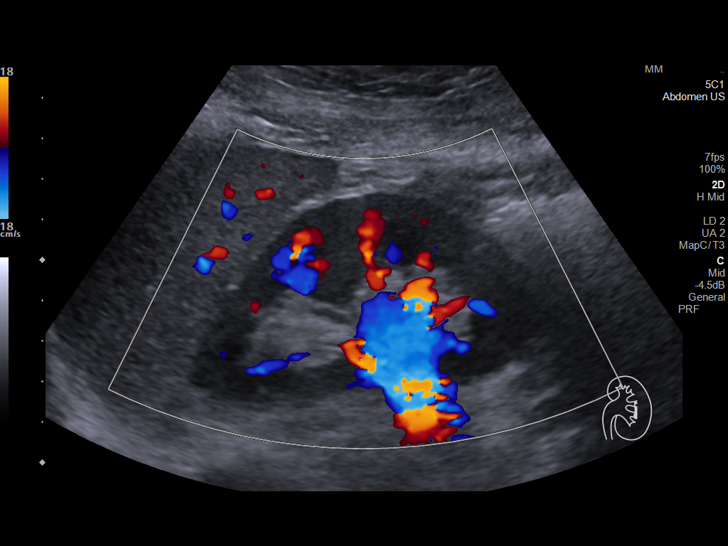
[im 27/43]
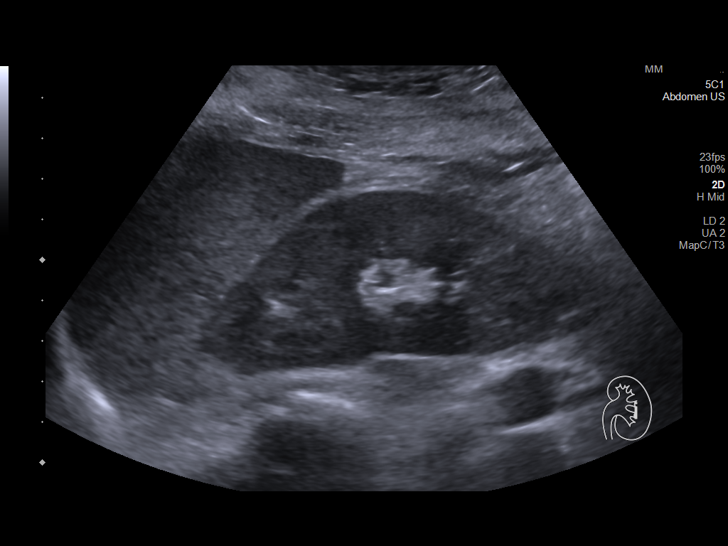
[im 29/43]
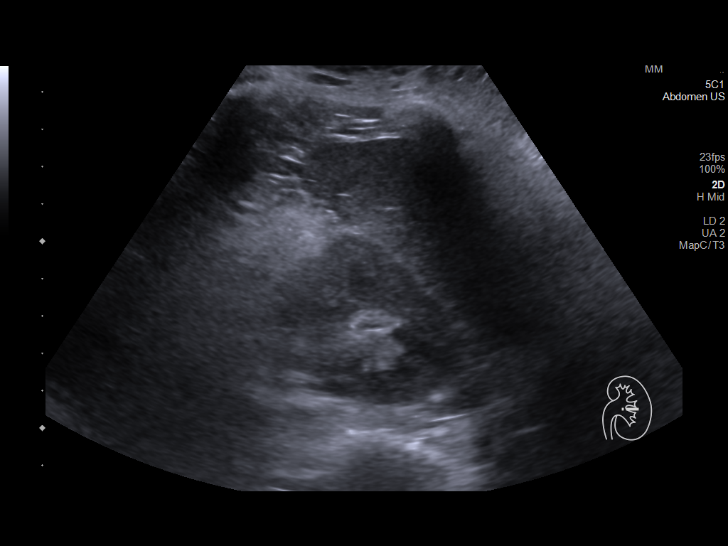
[im 32/43]
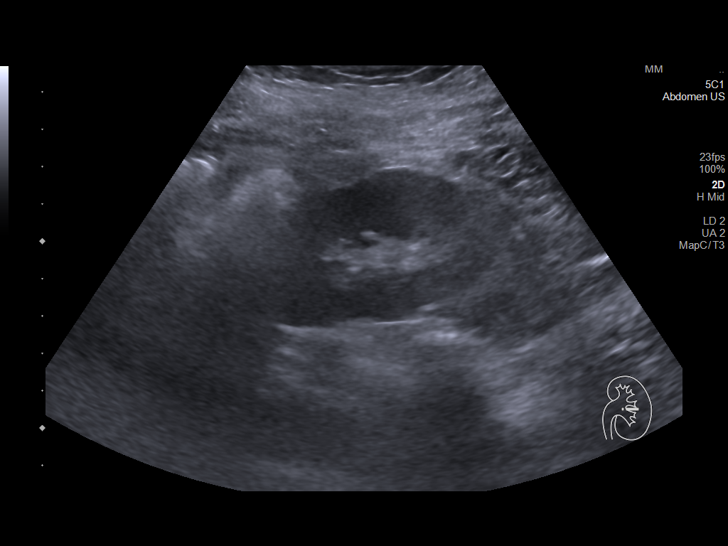
[im 36/43]
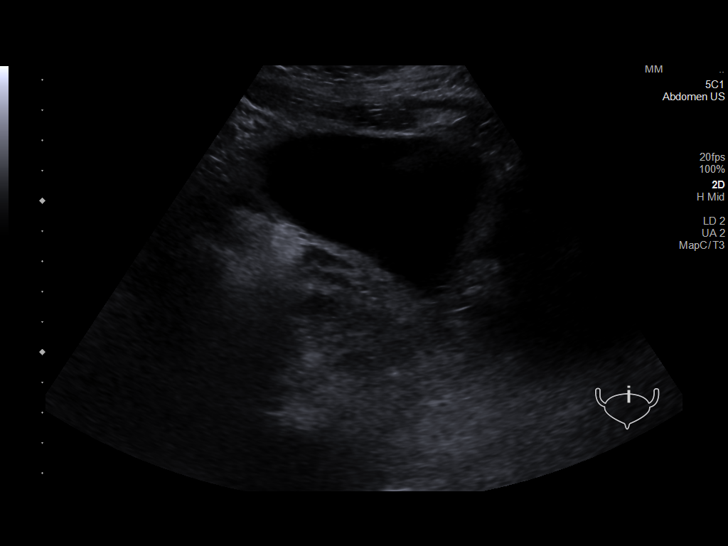
[im 39/43]
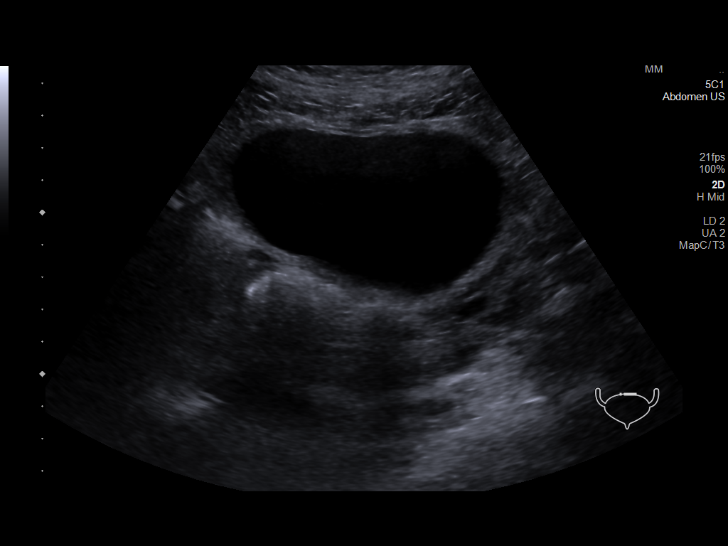
[im 43/43]
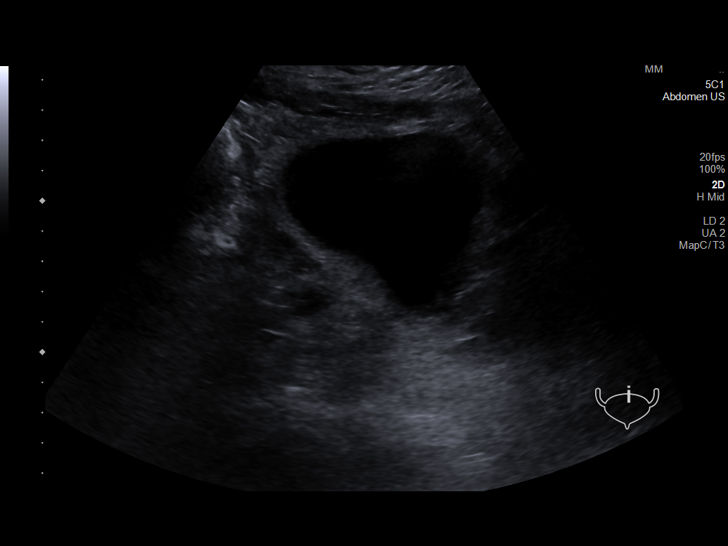

[14 of 25 positions shown; findings below may reference images not displayed]

FINDINGS: Right Kidney:

Renal measurements: 10.7 x 4.0 x 5.5 cm = volume: 125 mL. Renal
echogenicity within normal limits. No nephrolithiasis or
hydronephrosis. No focal renal mass.

Left Kidney:

Renal measurements: 10.1 x 4.9 x 5.7 cm = volume: 147 mL. Renal
echogenicity within normal limits. No nephrolithiasis or
hydronephrosis. No focal renal mass.

Bladder:

Appears normal for degree of bladder distention.

Other:

None.
IMPRESSION: Normal renal ultrasound. No hydronephrosis or other significant
finding.
